# Patient Record
Sex: Female | Born: 1950 | Race: Black or African American | Hispanic: No
Health system: Southern US, Community
[De-identification: ages and names within clinical notes are randomized; demographics above are authoritative.]

---

## 2004-11-12 ENCOUNTER — Emergency Department: Payer: Self-pay | Admitting: Internal Medicine

## 2006-02-12 ENCOUNTER — Ambulatory Visit: Payer: Self-pay | Admitting: Internal Medicine

## 2007-03-31 ENCOUNTER — Ambulatory Visit: Payer: Self-pay | Admitting: Internal Medicine

## 2007-06-03 ENCOUNTER — Ambulatory Visit: Payer: Self-pay | Admitting: Gastroenterology

## 2008-04-27 ENCOUNTER — Ambulatory Visit: Payer: Self-pay | Admitting: Internal Medicine

## 2010-08-26 ENCOUNTER — Ambulatory Visit: Payer: Self-pay | Admitting: Internal Medicine

## 2010-09-05 ENCOUNTER — Ambulatory Visit: Payer: Self-pay | Admitting: Gastroenterology

## 2011-05-28 ENCOUNTER — Ambulatory Visit: Payer: Self-pay | Admitting: Internal Medicine

## 2011-06-11 ENCOUNTER — Ambulatory Visit: Payer: Self-pay | Admitting: Gastroenterology

## 2011-06-15 ENCOUNTER — Ambulatory Visit: Payer: Self-pay | Admitting: Gastroenterology

## 2011-06-17 LAB — PATHOLOGY REPORT

## 2011-06-18 ENCOUNTER — Ambulatory Visit: Payer: Self-pay | Admitting: Gastroenterology

## 2011-06-29 ENCOUNTER — Telehealth: Payer: Self-pay

## 2011-06-29 ENCOUNTER — Telehealth: Payer: Self-pay | Admitting: Gastroenterology

## 2011-06-29 DIAGNOSIS — K8689 Other specified diseases of pancreas: Secondary | ICD-10-CM

## 2011-06-29 NOTE — Telephone Encounter (Signed)
Pt aware of the change in location and time of the procedure pt has also been reinstructed

## 2011-06-29 NOTE — Telephone Encounter (Signed)
Need to call [place] and add to Thursday schedule for 3 pm pt already aware

## 2011-06-29 NOTE — Telephone Encounter (Signed)
Pt aware meds reviewed and instructions mailed

## 2011-06-30 NOTE — Telephone Encounter (Signed)
Pt added to the [facility] schedule spoke with [place]

## 2011-07-01 ENCOUNTER — Telehealth: Payer: Self-pay | Admitting: Gastroenterology

## 2011-07-01 NOTE — Telephone Encounter (Signed)
Pt had question about her EUS all questions were answered

## 2011-07-02 ENCOUNTER — Encounter: Payer: Self-pay | Admitting: Gastroenterology

## 2011-07-02 ENCOUNTER — Ambulatory Visit: Payer: Self-pay | Admitting: Oncology

## 2011-07-02 ENCOUNTER — Ambulatory Visit: Payer: Self-pay

## 2011-07-07 ENCOUNTER — Telehealth: Payer: Self-pay | Admitting: Gastroenterology

## 2011-07-07 NOTE — Telephone Encounter (Signed)
I spoke with her about FNA results (adenocarcinoma) I also discussed with Dr. Marva Panda, he will be arranging oncology referral.

## 2011-07-08 ENCOUNTER — Encounter: Payer: Self-pay | Admitting: Gastroenterology

## 2011-07-10 ENCOUNTER — Ambulatory Visit: Payer: Self-pay | Admitting: Oncology

## 2011-07-21 ENCOUNTER — Ambulatory Visit: Payer: Self-pay | Admitting: Surgery

## 2011-07-23 ENCOUNTER — Ambulatory Visit: Payer: Self-pay | Admitting: Oncology

## 2011-08-22 ENCOUNTER — Ambulatory Visit: Payer: Self-pay | Admitting: Oncology

## 2011-09-22 ENCOUNTER — Ambulatory Visit: Payer: Self-pay | Admitting: Oncology

## 2011-10-15 LAB — COMPREHENSIVE METABOLIC PANEL
Albumin: 3.5 g/dL (ref 3.4–5.0)
Alkaline Phosphatase: 189 U/L — ABNORMAL HIGH (ref 50–136)
Anion Gap: 12 (ref 7–16)
BUN: 12 mg/dL (ref 7–18)
Chloride: 101 mmol/L (ref 98–107)
Creatinine: 1.13 mg/dL (ref 0.60–1.30)
EGFR (Non-African Amer.): 52 — ABNORMAL LOW
Glucose: 85 mg/dL (ref 65–99)
Osmolality: 273 (ref 275–301)
SGOT(AST): 32 U/L (ref 15–37)
SGPT (ALT): 37 U/L
Sodium: 137 mmol/L (ref 136–145)
Total Protein: 8.4 g/dL — ABNORMAL HIGH (ref 6.4–8.2)

## 2011-10-15 LAB — CBC CANCER CENTER
Basophil #: 0.1 x10 3/mm (ref 0.0–0.1)
HCT: 32.6 % — ABNORMAL LOW (ref 35.0–47.0)
HGB: 11.1 g/dL — ABNORMAL LOW (ref 12.0–16.0)
Lymphocyte #: 1.5 x10 3/mm (ref 1.0–3.6)
Lymphocyte %: 17.1 %
MCH: 29.9 pg (ref 26.0–34.0)
Monocyte #: 0.6 x10 3/mm (ref 0.0–0.7)
Monocyte %: 6.4 %
Neutrophil #: 6.6 x10 3/mm — ABNORMAL HIGH (ref 1.4–6.5)
Neutrophil %: 74.6 %
Platelet: 213 x10 3/mm (ref 150–440)
RBC: 3.73 10*6/uL — ABNORMAL LOW (ref 3.80–5.20)
WBC: 8.8 x10 3/mm (ref 3.6–11.0)

## 2011-10-16 LAB — CANCER ANTIGEN 19-9: CA 19-9: 55 U/mL — ABNORMAL HIGH (ref 0–35)

## 2011-10-23 ENCOUNTER — Ambulatory Visit: Payer: Self-pay | Admitting: Oncology

## 2011-10-29 LAB — CBC CANCER CENTER
Basophil #: 0 x10 3/mm (ref 0.0–0.1)
Basophil %: 0 %
Eosinophil #: 0 x10 3/mm (ref 0.0–0.7)
Eosinophil %: 0.6 %
HCT: 30.9 % — ABNORMAL LOW (ref 35.0–47.0)
HGB: 10.5 g/dL — ABNORMAL LOW (ref 12.0–16.0)
Lymphocyte %: 16.1 %
MCHC: 34.1 g/dL (ref 32.0–36.0)
Monocyte #: 0.3 x10 3/mm (ref 0.0–0.7)
RDW: 14.1 % (ref 11.5–14.5)

## 2011-11-05 LAB — CBC CANCER CENTER
Basophil #: 0 x10 3/mm (ref 0.0–0.1)
Eosinophil %: 0.3 %
Lymphocyte %: 27.9 %
Lymphocytes: 34 %
MCHC: 34 g/dL (ref 32.0–36.0)
Monocyte #: 0.3 x10 3/mm (ref 0.0–0.7)
Monocyte %: 7.3 %
Monocytes: 7 %
NRBC/100 WBC: 1 /100
Platelet: 124 x10 3/mm — ABNORMAL LOW (ref 150–440)
RBC: 3.47 10*6/uL — ABNORMAL LOW (ref 3.80–5.20)
RDW: 14.4 % (ref 11.5–14.5)
Segmented Neutrophils: 59 %
WBC: 3.6 x10 3/mm (ref 3.6–11.0)

## 2011-11-12 LAB — CBC CANCER CENTER
Bands: 2 %
Basophil #: 0 x10 3/mm (ref 0.0–0.1)
Eosinophil #: 0 x10 3/mm (ref 0.0–0.7)
HCT: 28 % — ABNORMAL LOW (ref 35.0–47.0)
HGB: 9.8 g/dL — ABNORMAL LOW (ref 12.0–16.0)
Lymphocyte #: 0.9 x10 3/mm — ABNORMAL LOW (ref 1.0–3.6)
Lymphocyte %: 21.5 %
Lymphocytes: 22 %
MCH: 30.3 pg (ref 26.0–34.0)
Monocyte %: 5.2 %
Monocytes: 3 %
Neutrophil #: 3.1 x10 3/mm (ref 1.4–6.5)
Neutrophil %: 73 %
Platelet: 163 x10 3/mm (ref 150–440)
RDW: 14.6 % — ABNORMAL HIGH (ref 11.5–14.5)
Segmented Neutrophils: 73 %
WBC: 4.3 x10 3/mm (ref 3.6–11.0)

## 2011-11-18 LAB — CBC CANCER CENTER
Bands: 3 %
Basophil %: 0 %
Basophil: 1 %
HGB: 9.1 g/dL — ABNORMAL LOW (ref 12.0–16.0)
Lymphocyte #: 0.6 x10 3/mm — ABNORMAL LOW (ref 1.0–3.6)
Lymphocyte %: 34.3 %
Lymphocytes: 33 %
MCH: 30.2 pg (ref 26.0–34.0)
MCHC: 34.9 g/dL (ref 32.0–36.0)
Metamyelocyte: 3 %
Monocyte #: 0.1 x10 3/mm (ref 0.0–0.7)
Monocyte %: 6.6 %
Monocytes: 11 %
Myelocyte: 3 %
Neutrophil #: 1.1 x10 3/mm — ABNORMAL LOW (ref 1.4–6.5)
Platelet: 188 x10 3/mm (ref 150–440)
RDW: 15.4 % — ABNORMAL HIGH (ref 11.5–14.5)
Variant Lymphocyte: 2 %

## 2011-11-18 LAB — COMPREHENSIVE METABOLIC PANEL
BUN: 17 mg/dL (ref 7–18)
Bilirubin,Total: 0.4 mg/dL (ref 0.2–1.0)
Chloride: 102 mmol/L (ref 98–107)
Co2: 24 mmol/L (ref 21–32)
Creatinine: 1.25 mg/dL (ref 0.60–1.30)
EGFR (African American): 57 — ABNORMAL LOW
EGFR (Non-African Amer.): 47 — ABNORMAL LOW
Osmolality: 275 (ref 275–301)
SGOT(AST): 73 U/L — ABNORMAL HIGH (ref 15–37)
SGPT (ALT): 166 U/L — ABNORMAL HIGH
Sodium: 137 mmol/L (ref 136–145)
Total Protein: 8 g/dL (ref 6.4–8.2)

## 2011-11-18 LAB — PROTIME-INR
INR: 0.9
Prothrombin Time: 12.3 secs (ref 11.5–14.7)

## 2011-11-20 ENCOUNTER — Ambulatory Visit: Payer: Self-pay | Admitting: Oncology

## 2011-11-26 LAB — CBC CANCER CENTER
Bands: 2 %
Basophil %: 0.1 %
Eosinophil #: 0.1 x10 3/mm (ref 0.0–0.7)
Eosinophil %: 0.6 %
HCT: 28.5 % — ABNORMAL LOW (ref 35.0–47.0)
Lymphocyte %: 11.1 %
Lymphocytes: 18 %
MCH: 30.2 pg (ref 26.0–34.0)
MCHC: 34.3 g/dL (ref 32.0–36.0)
Monocyte #: 1.3 x10 3/mm — ABNORMAL HIGH (ref 0.0–0.7)
Monocyte %: 11.9 %
Neutrophil %: 76.3 %
Platelet: 372 x10 3/mm (ref 150–440)
RBC: 3.24 10*6/uL — ABNORMAL LOW (ref 3.80–5.20)
Segmented Neutrophils: 68 %

## 2011-12-03 LAB — BASIC METABOLIC PANEL
Anion Gap: 13 (ref 7–16)
Calcium, Total: 8.9 mg/dL (ref 8.5–10.1)
Chloride: 100 mmol/L (ref 98–107)
Co2: 22 mmol/L (ref 21–32)
EGFR (African American): 48 — ABNORMAL LOW
Osmolality: 272 (ref 275–301)
Potassium: 3.8 mmol/L (ref 3.5–5.1)

## 2011-12-03 LAB — CBC CANCER CENTER
Basophil %: 0.3 %
Eosinophil %: 0.5 %
HCT: 27.8 % — ABNORMAL LOW (ref 35.0–47.0)
HGB: 9.5 g/dL — ABNORMAL LOW (ref 12.0–16.0)
Lymphocyte #: 1 x10 3/mm (ref 1.0–3.6)
MCH: 30.2 pg (ref 26.0–34.0)
MCV: 89 fL (ref 80–100)
Monocyte #: 0.6 x10 3/mm (ref 0.0–0.7)
Monocyte %: 10.2 %
Neutrophil #: 4.1 x10 3/mm (ref 1.4–6.5)
RBC: 3.14 10*6/uL — ABNORMAL LOW (ref 3.80–5.20)

## 2011-12-03 LAB — MAGNESIUM: Magnesium: 2.1 mg/dL

## 2011-12-10 LAB — CBC CANCER CENTER
Basophil %: 0 %
Eosinophil #: 0 x10 3/mm (ref 0.0–0.7)
Eosinophil %: 0.2 %
HCT: 27.2 % — ABNORMAL LOW (ref 35.0–47.0)
Lymphocyte %: 21.4 %
MCH: 30.2 pg (ref 26.0–34.0)
MCHC: 33.3 g/dL (ref 32.0–36.0)
MCV: 91 fL (ref 80–100)
Monocyte #: 0.4 x10 3/mm (ref 0.0–0.7)
Monocyte %: 9.8 %
Neutrophil #: 2.8 x10 3/mm (ref 1.4–6.5)
Neutrophil %: 68.6 %
Platelet: 176 x10 3/mm (ref 150–440)
RDW: 17.4 % — ABNORMAL HIGH (ref 11.5–14.5)
WBC: 4 x10 3/mm (ref 3.6–11.0)

## 2011-12-10 LAB — BASIC METABOLIC PANEL
Anion Gap: 7 (ref 7–16)
BUN: 15 mg/dL (ref 7–18)
Creatinine: 1.19 mg/dL (ref 0.60–1.30)
EGFR (African American): 60 — ABNORMAL LOW
Osmolality: 271 (ref 275–301)

## 2011-12-21 ENCOUNTER — Ambulatory Visit: Payer: Self-pay | Admitting: Oncology

## 2011-12-24 LAB — COMPREHENSIVE METABOLIC PANEL
Alkaline Phosphatase: 230 U/L — ABNORMAL HIGH (ref 50–136)
BUN: 15 mg/dL (ref 7–18)
Bilirubin,Total: 0.4 mg/dL (ref 0.2–1.0)
Calcium, Total: 9.5 mg/dL (ref 8.5–10.1)
Chloride: 102 mmol/L (ref 98–107)
Co2: 23 mmol/L (ref 21–32)
EGFR (Non-African Amer.): 45 — ABNORMAL LOW
Osmolality: 273 (ref 275–301)
Potassium: 5.3 mmol/L — ABNORMAL HIGH (ref 3.5–5.1)
SGOT(AST): 70 U/L — ABNORMAL HIGH (ref 15–37)
SGPT (ALT): 71 U/L
Sodium: 136 mmol/L (ref 136–145)
Total Protein: 8.2 g/dL (ref 6.4–8.2)

## 2011-12-24 LAB — CBC CANCER CENTER
Eosinophil: 1 %
HCT: 29.7 % — ABNORMAL LOW (ref 35.0–47.0)
MCHC: 34.1 g/dL (ref 32.0–36.0)
MCV: 90 fL (ref 80–100)
Platelet: 477 x10 3/mm — ABNORMAL HIGH (ref 150–440)
RBC: 3.29 10*6/uL — ABNORMAL LOW (ref 3.80–5.20)
RDW: 19.8 % — ABNORMAL HIGH (ref 11.5–14.5)

## 2012-01-20 ENCOUNTER — Ambulatory Visit: Payer: Self-pay | Admitting: Oncology

## 2012-01-21 LAB — COMPREHENSIVE METABOLIC PANEL
Albumin: 3.4 g/dL (ref 3.4–5.0)
Alkaline Phosphatase: 376 U/L — ABNORMAL HIGH (ref 50–136)
Anion Gap: 10 (ref 7–16)
BUN: 20 mg/dL — ABNORMAL HIGH (ref 7–18)
Bilirubin,Total: 0.5 mg/dL (ref 0.2–1.0)
Chloride: 101 mmol/L (ref 98–107)
Creatinine: 1.34 mg/dL — ABNORMAL HIGH (ref 0.60–1.30)
EGFR (African American): 50 — ABNORMAL LOW
EGFR (Non-African Amer.): 43 — ABNORMAL LOW
Osmolality: 277 (ref 275–301)
Potassium: 4 mmol/L (ref 3.5–5.1)
SGPT (ALT): 192 U/L — ABNORMAL HIGH
Sodium: 138 mmol/L (ref 136–145)
Total Protein: 8.1 g/dL (ref 6.4–8.2)

## 2012-01-21 LAB — CBC CANCER CENTER
Basophil #: 0.1 x10 3/mm (ref 0.0–0.1)
Basophil %: 0.8 %
Eosinophil #: 0.2 x10 3/mm (ref 0.0–0.7)
Eosinophil %: 2.2 %
HCT: 30.4 % — ABNORMAL LOW (ref 35.0–47.0)
MCH: 28.8 pg (ref 26.0–34.0)
MCHC: 33.5 g/dL (ref 32.0–36.0)
MCV: 86 fL (ref 80–100)
Monocyte #: 0.8 x10 3/mm (ref 0.2–0.9)
Monocyte %: 9.4 %
Neutrophil %: 70.5 %
RBC: 3.53 10*6/uL — ABNORMAL LOW (ref 3.80–5.20)
RDW: 17 % — ABNORMAL HIGH (ref 11.5–14.5)
WBC: 8.7 x10 3/mm (ref 3.6–11.0)

## 2012-02-20 ENCOUNTER — Ambulatory Visit: Payer: Self-pay | Admitting: Oncology

## 2012-02-23 LAB — COMPREHENSIVE METABOLIC PANEL
Albumin: 3.6 g/dL (ref 3.4–5.0)
Alkaline Phosphatase: 621 U/L — ABNORMAL HIGH (ref 50–136)
Anion Gap: 9 (ref 7–16)
BUN: 28 mg/dL — ABNORMAL HIGH (ref 7–18)
Bilirubin,Total: 0.5 mg/dL (ref 0.2–1.0)
Calcium, Total: 9.8 mg/dL (ref 8.5–10.1)
Chloride: 100 mmol/L (ref 98–107)
Co2: 26 mmol/L (ref 21–32)
Creatinine: 1.37 mg/dL — ABNORMAL HIGH (ref 0.60–1.30)
Glucose: 101 mg/dL — ABNORMAL HIGH (ref 65–99)
Osmolality: 276 (ref 275–301)
SGOT(AST): 71 U/L — ABNORMAL HIGH (ref 15–37)
SGPT (ALT): 70 U/L
Total Protein: 9 g/dL — ABNORMAL HIGH (ref 6.4–8.2)

## 2012-02-23 LAB — CBC CANCER CENTER
Basophil #: 0 x10 3/mm (ref 0.0–0.1)
Basophil %: 0.4 %
Eosinophil #: 0.1 x10 3/mm (ref 0.0–0.7)
Eosinophil %: 1 %
HCT: 34.3 % — ABNORMAL LOW (ref 35.0–47.0)
HGB: 11.5 g/dL — ABNORMAL LOW (ref 12.0–16.0)
Lymphocyte #: 1.4 x10 3/mm (ref 1.0–3.6)
Lymphocyte %: 14.3 %
MCH: 27.5 pg (ref 26.0–34.0)
Monocyte #: 0.6 x10 3/mm (ref 0.2–0.9)
Neutrophil #: 7.7 x10 3/mm — ABNORMAL HIGH (ref 1.4–6.5)
Neutrophil %: 77.8 %
Platelet: 251 x10 3/mm (ref 150–440)
RDW: 16.2 % — ABNORMAL HIGH (ref 11.5–14.5)
WBC: 10 x10 3/mm (ref 3.6–11.0)

## 2012-02-24 LAB — CANCER ANTIGEN 19-9: CA 19-9: 125 U/mL — ABNORMAL HIGH (ref 0–35)

## 2012-03-02 LAB — CBC CANCER CENTER
Basophil #: 0.1 x10 3/mm (ref 0.0–0.1)
Basophil %: 0.6 %
HCT: 32.8 % — ABNORMAL LOW (ref 35.0–47.0)
Lymphocyte #: 0.7 x10 3/mm — ABNORMAL LOW (ref 1.0–3.6)
MCH: 27.2 pg (ref 26.0–34.0)
MCHC: 33.9 g/dL (ref 32.0–36.0)
MCV: 80 fL (ref 80–100)
Neutrophil #: 7.9 x10 3/mm — ABNORMAL HIGH (ref 1.4–6.5)
Neutrophil %: 82.1 %
RBC: 4.09 10*6/uL (ref 3.80–5.20)
RDW: 15.9 % — ABNORMAL HIGH (ref 11.5–14.5)
WBC: 9.7 x10 3/mm (ref 3.6–11.0)

## 2012-03-09 LAB — CBC CANCER CENTER
Basophil #: 0 x10 3/mm (ref 0.0–0.1)
Eosinophil #: 0 x10 3/mm (ref 0.0–0.7)
Eosinophil %: 0.4 %
Lymphocyte #: 1.1 x10 3/mm (ref 1.0–3.6)
MCH: 27.1 pg (ref 26.0–34.0)
Neutrophil #: 5.4 x10 3/mm (ref 1.4–6.5)
Neutrophil %: 74.4 %
Platelet: 199 x10 3/mm (ref 150–440)
RBC: 3.75 10*6/uL — ABNORMAL LOW (ref 3.80–5.20)
WBC: 7.2 x10 3/mm (ref 3.6–11.0)

## 2012-03-16 LAB — CBC CANCER CENTER
Basophil #: 0 x10 3/mm (ref 0.0–0.1)
Basophil %: 0.2 %
Eosinophil #: 0 x10 3/mm (ref 0.0–0.7)
Eosinophil %: 0.9 %
HGB: 9.7 g/dL — ABNORMAL LOW (ref 12.0–16.0)
Lymphocyte #: 1 x10 3/mm (ref 1.0–3.6)
Lymphocyte %: 19.2 %
MCH: 26.9 pg (ref 26.0–34.0)
MCHC: 33.8 g/dL (ref 32.0–36.0)
MCV: 80 fL (ref 80–100)
Monocyte #: 0.5 x10 3/mm (ref 0.2–0.9)
Monocyte %: 9.1 %
Neutrophil %: 70.6 %
RBC: 3.6 10*6/uL — ABNORMAL LOW (ref 3.80–5.20)
RDW: 15.7 % — ABNORMAL HIGH (ref 11.5–14.5)

## 2012-03-16 LAB — COMPREHENSIVE METABOLIC PANEL
Albumin: 3 g/dL — ABNORMAL LOW (ref 3.4–5.0)
Bilirubin,Total: 0.6 mg/dL (ref 0.2–1.0)
Calcium, Total: 9.2 mg/dL (ref 8.5–10.1)
Chloride: 96 mmol/L — ABNORMAL LOW (ref 98–107)
Creatinine: 1.23 mg/dL (ref 0.60–1.30)
EGFR (African American): 55 — ABNORMAL LOW
EGFR (Non-African Amer.): 48 — ABNORMAL LOW
Glucose: 111 mg/dL — ABNORMAL HIGH (ref 65–99)
Osmolality: 269 (ref 275–301)
SGPT (ALT): 108 U/L — ABNORMAL HIGH
Sodium: 132 mmol/L — ABNORMAL LOW (ref 136–145)
Total Protein: 8 g/dL (ref 6.4–8.2)

## 2012-03-21 ENCOUNTER — Ambulatory Visit: Payer: Self-pay | Admitting: Oncology

## 2012-03-30 LAB — COMPREHENSIVE METABOLIC PANEL
Alkaline Phosphatase: 496 U/L — ABNORMAL HIGH (ref 50–136)
Anion Gap: 10 (ref 7–16)
BUN: 17 mg/dL (ref 7–18)
Bilirubin,Total: 0.6 mg/dL (ref 0.2–1.0)
Calcium, Total: 9.2 mg/dL (ref 8.5–10.1)
Chloride: 101 mmol/L (ref 98–107)
Creatinine: 1.22 mg/dL (ref 0.60–1.30)
EGFR (African American): 56 — ABNORMAL LOW
EGFR (Non-African Amer.): 48 — ABNORMAL LOW
Glucose: 95 mg/dL (ref 65–99)
Osmolality: 271 (ref 275–301)
Potassium: 4.2 mmol/L (ref 3.5–5.1)
Sodium: 135 mmol/L — ABNORMAL LOW (ref 136–145)
Total Protein: 7.9 g/dL (ref 6.4–8.2)

## 2012-03-30 LAB — CBC CANCER CENTER
Basophil #: 0.1 x10 3/mm (ref 0.0–0.1)
HCT: 27.2 % — ABNORMAL LOW (ref 35.0–47.0)
HGB: 9.2 g/dL — ABNORMAL LOW (ref 12.0–16.0)
Lymphocyte #: 1.1 x10 3/mm (ref 1.0–3.6)
MCH: 27.7 pg (ref 26.0–34.0)
MCHC: 33.9 g/dL (ref 32.0–36.0)
MCV: 82 fL (ref 80–100)
Monocyte #: 1.4 x10 3/mm — ABNORMAL HIGH (ref 0.2–0.9)
Monocyte %: 18.5 %
Neutrophil #: 5 x10 3/mm (ref 1.4–6.5)
Platelet: 490 x10 3/mm — ABNORMAL HIGH (ref 150–440)
RDW: 16.9 % — ABNORMAL HIGH (ref 11.5–14.5)
WBC: 7.7 x10 3/mm (ref 3.6–11.0)

## 2012-04-06 LAB — BASIC METABOLIC PANEL
Anion Gap: 10 (ref 7–16)
BUN: 19 mg/dL — ABNORMAL HIGH (ref 7–18)
Co2: 24 mmol/L (ref 21–32)
Creatinine: 1.22 mg/dL (ref 0.60–1.30)
EGFR (African American): 56 — ABNORMAL LOW
Glucose: 120 mg/dL — ABNORMAL HIGH (ref 65–99)
Potassium: 4.3 mmol/L (ref 3.5–5.1)
Sodium: 135 mmol/L — ABNORMAL LOW (ref 136–145)

## 2012-04-06 LAB — CBC CANCER CENTER
Basophil #: 0.1 x10 3/mm (ref 0.0–0.1)
Eosinophil #: 0.1 x10 3/mm (ref 0.0–0.7)
HCT: 26.5 % — ABNORMAL LOW (ref 35.0–47.0)
Lymphocyte #: 0.9 x10 3/mm — ABNORMAL LOW (ref 1.0–3.6)
MCHC: 34 g/dL (ref 32.0–36.0)
MCV: 81 fL (ref 80–100)
Neutrophil #: 3.7 x10 3/mm (ref 1.4–6.5)
Neutrophil %: 67 %
RBC: 3.25 10*6/uL — ABNORMAL LOW (ref 3.80–5.20)
RDW: 17.2 % — ABNORMAL HIGH (ref 11.5–14.5)

## 2012-04-13 LAB — BASIC METABOLIC PANEL
Anion Gap: 9 (ref 7–16)
BUN: 16 mg/dL (ref 7–18)
Calcium, Total: 9.3 mg/dL (ref 8.5–10.1)
Creatinine: 1.18 mg/dL (ref 0.60–1.30)
EGFR (African American): 58 — ABNORMAL LOW
EGFR (Non-African Amer.): 50 — ABNORMAL LOW
Glucose: 69 mg/dL (ref 65–99)
Potassium: 4 mmol/L (ref 3.5–5.1)
Sodium: 134 mmol/L — ABNORMAL LOW (ref 136–145)

## 2012-04-13 LAB — CBC CANCER CENTER
Basophil #: 0 x10 3/mm (ref 0.0–0.1)
Basophil %: 0.6 %
Eosinophil #: 0 x10 3/mm (ref 0.0–0.7)
Eosinophil %: 0.4 %
HCT: 25.3 % — ABNORMAL LOW (ref 35.0–47.0)
Lymphocyte #: 0.9 x10 3/mm — ABNORMAL LOW (ref 1.0–3.6)
MCHC: 33.4 g/dL (ref 32.0–36.0)
MCV: 82 fL (ref 80–100)
Monocyte #: 0.5 x10 3/mm (ref 0.2–0.9)
Monocyte %: 14.7 %
Neutrophil %: 58 %
Platelet: 156 x10 3/mm (ref 150–440)
RBC: 3.08 10*6/uL — ABNORMAL LOW (ref 3.80–5.20)
RDW: 17.4 % — ABNORMAL HIGH (ref 11.5–14.5)
WBC: 3.4 x10 3/mm — ABNORMAL LOW (ref 3.6–11.0)

## 2012-04-14 LAB — CANCER ANTIGEN 19-9: CA 19-9: 77 U/mL — ABNORMAL HIGH (ref 0–35)

## 2012-04-21 ENCOUNTER — Ambulatory Visit: Payer: Self-pay | Admitting: Oncology

## 2012-04-27 LAB — COMPREHENSIVE METABOLIC PANEL
Albumin: 2.9 g/dL — ABNORMAL LOW (ref 3.4–5.0)
Alkaline Phosphatase: 374 U/L — ABNORMAL HIGH (ref 50–136)
Anion Gap: 11 (ref 7–16)
BUN: 19 mg/dL — ABNORMAL HIGH (ref 7–18)
Calcium, Total: 9.1 mg/dL (ref 8.5–10.1)
Co2: 25 mmol/L (ref 21–32)
EGFR (Non-African Amer.): 41 — ABNORMAL LOW
Osmolality: 276 (ref 275–301)
Potassium: 3.6 mmol/L (ref 3.5–5.1)
Sodium: 135 mmol/L — ABNORMAL LOW (ref 136–145)

## 2012-04-27 LAB — CBC CANCER CENTER
Basophil: 1 %
Eosinophil: 3 %
HCT: 26.2 % — ABNORMAL LOW (ref 35.0–47.0)
Lymphocytes: 12 %
MCHC: 34.2 g/dL (ref 32.0–36.0)
Segmented Neutrophils: 68 %

## 2012-05-04 LAB — CBC CANCER CENTER
Basophil #: 0.1 x10 3/mm (ref 0.0–0.1)
Eosinophil #: 0.1 x10 3/mm (ref 0.0–0.7)
Eosinophil %: 1.4 %
HCT: 26.1 % — ABNORMAL LOW (ref 35.0–47.0)
HGB: 8.7 g/dL — ABNORMAL LOW (ref 12.0–16.0)
Lymphocyte #: 1.1 x10 3/mm (ref 1.0–3.6)
Lymphocyte %: 23.8 %
MCH: 28.2 pg (ref 26.0–34.0)
Monocyte #: 0.8 x10 3/mm (ref 0.2–0.9)
Neutrophil %: 56.9 %
RBC: 3.09 10*6/uL — ABNORMAL LOW (ref 3.80–5.20)
WBC: 4.7 x10 3/mm (ref 3.6–11.0)

## 2012-05-04 LAB — IRON AND TIBC
Iron Bind.Cap.(Total): 268 ug/dL (ref 250–450)
Iron Saturation: 19 %
Iron: 52 ug/dL (ref 50–170)
Unbound Iron-Bind.Cap.: 216 ug/dL

## 2012-05-04 LAB — COMPREHENSIVE METABOLIC PANEL
Alkaline Phosphatase: 340 U/L — ABNORMAL HIGH (ref 50–136)
BUN: 17 mg/dL (ref 7–18)
Chloride: 101 mmol/L (ref 98–107)
Creatinine: 1.43 mg/dL — ABNORMAL HIGH (ref 0.60–1.30)
EGFR (Non-African Amer.): 40 — ABNORMAL LOW
Glucose: 112 mg/dL — ABNORMAL HIGH (ref 65–99)
Osmolality: 272 (ref 275–301)
Potassium: 3.8 mmol/L (ref 3.5–5.1)
Sodium: 135 mmol/L — ABNORMAL LOW (ref 136–145)

## 2012-05-04 LAB — FERRITIN: Ferritin (ARMC): 851 ng/mL — ABNORMAL HIGH (ref 8–388)

## 2012-05-11 LAB — CBC CANCER CENTER
Basophil #: 0 x10 3/mm (ref 0.0–0.1)
Basophil %: 0.8 %
HCT: 25.3 % — ABNORMAL LOW (ref 35.0–47.0)
HGB: 8.5 g/dL — ABNORMAL LOW (ref 12.0–16.0)
Lymphocyte %: 20.1 %
Monocyte %: 9.3 %
Neutrophil #: 1.5 x10 3/mm (ref 1.4–6.5)
RBC: 2.97 10*6/uL — ABNORMAL LOW (ref 3.80–5.20)
RDW: 21.4 % — ABNORMAL HIGH (ref 11.5–14.5)
WBC: 2.3 x10 3/mm — ABNORMAL LOW (ref 3.6–11.0)

## 2012-05-11 LAB — COMPREHENSIVE METABOLIC PANEL
Albumin: 3.1 g/dL — ABNORMAL LOW (ref 3.4–5.0)
Anion Gap: 11 (ref 7–16)
BUN: 18 mg/dL (ref 7–18)
Bilirubin,Total: 0.5 mg/dL (ref 0.2–1.0)
Chloride: 103 mmol/L (ref 98–107)
Creatinine: 1.25 mg/dL (ref 0.60–1.30)
Glucose: 159 mg/dL — ABNORMAL HIGH (ref 65–99)
Potassium: 3.7 mmol/L (ref 3.5–5.1)
SGOT(AST): 48 U/L — ABNORMAL HIGH (ref 15–37)
Total Protein: 7.4 g/dL (ref 6.4–8.2)

## 2012-05-22 ENCOUNTER — Ambulatory Visit: Payer: Self-pay | Admitting: Oncology

## 2012-05-25 LAB — CBC CANCER CENTER
Bands: 2 %
Eosinophil: 4 %
HCT: 25.2 % — ABNORMAL LOW (ref 35.0–47.0)
HGB: 8.3 g/dL — ABNORMAL LOW (ref 12.0–16.0)
Lymphocytes: 4 %
MCH: 28 pg (ref 26.0–34.0)
MCHC: 32.8 g/dL (ref 32.0–36.0)
MCV: 85 fL (ref 80–100)
Monocytes: 13 %
RBC: 2.95 10*6/uL — ABNORMAL LOW (ref 3.80–5.20)
RDW: 22.2 % — ABNORMAL HIGH (ref 11.5–14.5)
Segmented Neutrophils: 77 %

## 2012-05-25 LAB — COMPREHENSIVE METABOLIC PANEL
Alkaline Phosphatase: 312 U/L — ABNORMAL HIGH (ref 50–136)
Bilirubin,Total: 0.5 mg/dL (ref 0.2–1.0)
Chloride: 98 mmol/L (ref 98–107)
Co2: 27 mmol/L (ref 21–32)
Creatinine: 1.47 mg/dL — ABNORMAL HIGH (ref 0.60–1.30)
EGFR (Non-African Amer.): 38 — ABNORMAL LOW
Osmolality: 267 (ref 275–301)
SGOT(AST): 53 U/L — ABNORMAL HIGH (ref 15–37)
Sodium: 133 mmol/L — ABNORMAL LOW (ref 136–145)

## 2012-06-01 LAB — CBC CANCER CENTER
Basophil #: 0.1 x10 3/mm (ref 0.0–0.1)
Eosinophil #: 0 x10 3/mm (ref 0.0–0.7)
HCT: 24.3 % — ABNORMAL LOW (ref 35.0–47.0)
HGB: 8.2 g/dL — ABNORMAL LOW (ref 12.0–16.0)
MCH: 28.5 pg (ref 26.0–34.0)
MCV: 85 fL (ref 80–100)
Monocyte %: 19.4 %
Platelet: 445 x10 3/mm — ABNORMAL HIGH (ref 150–440)
RBC: 2.87 10*6/uL — ABNORMAL LOW (ref 3.80–5.20)

## 2012-06-01 LAB — COMPREHENSIVE METABOLIC PANEL
Albumin: 2.7 g/dL — ABNORMAL LOW (ref 3.4–5.0)
Alkaline Phosphatase: 288 U/L — ABNORMAL HIGH (ref 50–136)
Anion Gap: 6 — ABNORMAL LOW (ref 7–16)
Calcium, Total: 8.5 mg/dL (ref 8.5–10.1)
Co2: 28 mmol/L (ref 21–32)
Creatinine: 1.46 mg/dL — ABNORMAL HIGH (ref 0.60–1.30)
Glucose: 122 mg/dL — ABNORMAL HIGH (ref 65–99)
SGPT (ALT): 46 U/L (ref 12–78)
Total Protein: 7.2 g/dL (ref 6.4–8.2)

## 2012-06-01 LAB — IRON AND TIBC
Iron Bind.Cap.(Total): 245 ug/dL — ABNORMAL LOW (ref 250–450)
Unbound Iron-Bind.Cap.: 207 ug/dL

## 2012-06-01 LAB — FERRITIN: Ferritin (ARMC): 1021 ng/mL — ABNORMAL HIGH (ref 8–388)

## 2012-06-01 LAB — FOLATE: Folic Acid: 19.6 ng/mL (ref 3.1–100.0)

## 2012-06-21 ENCOUNTER — Ambulatory Visit: Payer: Self-pay | Admitting: Oncology

## 2012-06-29 ENCOUNTER — Ambulatory Visit: Payer: Self-pay | Admitting: Oncology

## 2012-06-29 LAB — COMPREHENSIVE METABOLIC PANEL
Albumin: 3 g/dL — ABNORMAL LOW (ref 3.4–5.0)
Alkaline Phosphatase: 403 U/L — ABNORMAL HIGH (ref 50–136)
Anion Gap: 9 (ref 7–16)
BUN: 42 mg/dL — ABNORMAL HIGH (ref 7–18)
Bilirubin,Total: 0.9 mg/dL (ref 0.2–1.0)
Calcium, Total: 9.1 mg/dL (ref 8.5–10.1)
Creatinine: 1.84 mg/dL — ABNORMAL HIGH (ref 0.60–1.30)
EGFR (African American): 34 — ABNORMAL LOW
EGFR (Non-African Amer.): 29 — ABNORMAL LOW
Glucose: 101 mg/dL — ABNORMAL HIGH (ref 65–99)
Osmolality: 286 (ref 275–301)
Potassium: 5 mmol/L (ref 3.5–5.1)
SGOT(AST): 72 U/L — ABNORMAL HIGH (ref 15–37)
SGPT (ALT): 39 U/L (ref 12–78)
Sodium: 138 mmol/L (ref 136–145)
Total Protein: 7.7 g/dL (ref 6.4–8.2)

## 2012-06-29 LAB — CBC CANCER CENTER
Basophil #: 0.1 x10 3/mm (ref 0.0–0.1)
Basophil %: 1 %
Eosinophil %: 1.6 %
Lymphocyte %: 14.6 %
MCH: 26.1 pg (ref 26.0–34.0)
Monocyte #: 1.5 x10 3/mm — ABNORMAL HIGH (ref 0.2–0.9)
Monocyte %: 12.3 %
Neutrophil %: 70.5 %
Platelet: 164 x10 3/mm (ref 150–440)
RBC: 3.61 10*6/uL — ABNORMAL LOW (ref 3.80–5.20)

## 2012-06-30 LAB — CANCER ANTIGEN 19-9: CA 19-9: 155 U/mL — ABNORMAL HIGH (ref 0–35)

## 2012-07-05 LAB — CREATININE, SERUM
Creatinine: 2.87 mg/dL — ABNORMAL HIGH (ref 0.60–1.30)
EGFR (African American): 20 — ABNORMAL LOW
EGFR (Non-African Amer.): 17 — ABNORMAL LOW

## 2012-07-06 ENCOUNTER — Inpatient Hospital Stay: Payer: Self-pay | Admitting: Oncology

## 2012-07-06 LAB — BASIC METABOLIC PANEL
Chloride: 105 mmol/L (ref 98–107)
Co2: 22 mmol/L (ref 21–32)
Creatinine: 2.78 mg/dL — ABNORMAL HIGH (ref 0.60–1.30)
EGFR (African American): 21 — ABNORMAL LOW
EGFR (Non-African Amer.): 18 — ABNORMAL LOW
Glucose: 83 mg/dL (ref 65–99)
Osmolality: 284 (ref 275–301)
Potassium: 4.1 mmol/L (ref 3.5–5.1)
Sodium: 136 mmol/L (ref 136–145)

## 2012-07-06 LAB — URINALYSIS, COMPLETE
Glucose,UR: NEGATIVE mg/dL (ref 0–75)
Ketone: NEGATIVE
Nitrite: NEGATIVE
Ph: 6 (ref 4.5–8.0)
Protein: >=500
Specific Gravity: 1.011 (ref 1.003–1.030)
Squamous Epithelial: 2

## 2012-07-06 LAB — CREATININE, SERUM
Creatinine: 2.94 mg/dL — ABNORMAL HIGH (ref 0.60–1.30)
EGFR (African American): 19 — ABNORMAL LOW
EGFR (Non-African Amer.): 17 — ABNORMAL LOW

## 2012-07-07 LAB — BASIC METABOLIC PANEL
Anion Gap: 9 (ref 7–16)
BUN: 46 mg/dL — ABNORMAL HIGH (ref 7–18)
Calcium, Total: 8.5 mg/dL (ref 8.5–10.1)
Chloride: 109 mmol/L — ABNORMAL HIGH (ref 98–107)
Co2: 20 mmol/L — ABNORMAL LOW (ref 21–32)
Osmolality: 287 (ref 275–301)
Potassium: 3.9 mmol/L (ref 3.5–5.1)

## 2012-07-07 LAB — CBC WITH DIFFERENTIAL/PLATELET
Basophil #: 0.1 10*3/uL (ref 0.0–0.1)
Basophil #: 0.1 10*3/uL (ref 0.0–0.1)
Basophil %: 0.9 %
Eosinophil #: 0.3 10*3/uL (ref 0.0–0.7)
Eosinophil %: 1.9 %
HCT: 18.2 % — ABNORMAL LOW (ref 35.0–47.0)
HCT: 18 % — ABNORMAL LOW (ref 35.0–47.0)
HGB: 6.4 g/dL — ABNORMAL LOW (ref 12.0–16.0)
Lymphocyte #: 1.2 10*3/uL (ref 1.0–3.6)
Lymphocyte %: 11.7 %
MCH: 27.2 pg (ref 26.0–34.0)
MCH: 28 pg (ref 26.0–34.0)
MCHC: 34.9 g/dL (ref 32.0–36.0)
MCHC: 35.8 g/dL (ref 32.0–36.0)
MCV: 78 fL — ABNORMAL LOW (ref 80–100)
Monocyte #: 0.9 x10 3/mm (ref 0.2–0.9)
Monocyte #: 0.9 x10 3/mm (ref 0.2–0.9)
Neutrophil #: 8.6 10*3/uL — ABNORMAL HIGH (ref 1.4–6.5)
Neutrophil #: 8.2 10*3/uL — ABNORMAL HIGH (ref 1.4–6.5)
Platelet: 51 10*3/uL — ABNORMAL LOW (ref 150–440)
RBC: 2.3 10*6/uL — ABNORMAL LOW (ref 3.80–5.20)
RDW: 24.4 % — ABNORMAL HIGH (ref 11.5–14.5)
RDW: 24.1 % — ABNORMAL HIGH (ref 11.5–14.5)
WBC: 11 10*3/uL (ref 3.6–11.0)

## 2012-07-07 LAB — PROT IMMUNOELECTROPHORES(ARMC)

## 2012-07-07 LAB — URINE IEP, RANDOM

## 2012-07-08 LAB — CBC WITH DIFFERENTIAL/PLATELET
Basophil #: 0.1 10*3/uL (ref 0.0–0.1)
Basophil %: 0.8 %
Basophil %: 0.8 %
Eosinophil #: 0.3 10*3/uL (ref 0.0–0.7)
Eosinophil %: 1.9 %
HCT: 22.2 % — ABNORMAL LOW (ref 35.0–47.0)
HCT: 19.8 % — ABNORMAL LOW (ref 35.0–47.0)
HGB: 7.6 g/dL — ABNORMAL LOW (ref 12.0–16.0)
HGB: 7.2 g/dL — ABNORMAL LOW (ref 12.0–16.0)
Lymphocyte #: 1.5 10*3/uL (ref 1.0–3.6)
Lymphocyte #: 1.1 10*3/uL (ref 1.0–3.6)
Lymphocyte %: 12.8 %
MCHC: 34.2 g/dL (ref 32.0–36.0)
Monocyte #: 1.1 x10 3/mm — ABNORMAL HIGH (ref 0.2–0.9)
Monocyte #: 1.1 x10 3/mm — ABNORMAL HIGH (ref 0.2–0.9)
Monocyte %: 9.1 %
Neutrophil #: 8 10*3/uL — ABNORMAL HIGH (ref 1.4–6.5)
Neutrophil %: 75.1 %
Platelet: 46 10*3/uL — ABNORMAL LOW (ref 150–440)
RBC: 2.55 10*6/uL — ABNORMAL LOW (ref 3.80–5.20)
RDW: 22.4 % — ABNORMAL HIGH (ref 11.5–14.5)
RDW: 21.9 % — ABNORMAL HIGH (ref 11.5–14.5)
Segmented Neutrophils: 82 %
WBC: 11.7 10*3/uL — ABNORMAL HIGH (ref 3.6–11.0)
WBC: 10.5 10*3/uL (ref 3.6–11.0)

## 2012-07-08 LAB — CREATININE, SERUM
Creatinine: 3.27 mg/dL — ABNORMAL HIGH (ref 0.60–1.30)
EGFR (African American): 19 — ABNORMAL LOW
EGFR (African American): 17 — ABNORMAL LOW
EGFR (Non-African Amer.): 16 — ABNORMAL LOW
EGFR (Non-African Amer.): 15 — ABNORMAL LOW

## 2012-07-08 LAB — LACTATE DEHYDROGENASE: LDH: 714 U/L — ABNORMAL HIGH (ref 81–246)

## 2012-07-08 LAB — URINE CULTURE

## 2012-07-08 LAB — HEMOGLOBIN
HGB: 8.2 g/dL — ABNORMAL LOW (ref 12.0–16.0)
HGB: 7.7 g/dL — ABNORMAL LOW (ref 12.0–16.0)

## 2012-07-08 LAB — POTASSIUM: Potassium: 4.2 mmol/L (ref 3.5–5.1)

## 2012-07-09 LAB — COMPREHENSIVE METABOLIC PANEL
Albumin: 2.5 g/dL — ABNORMAL LOW (ref 3.4–5.0)
Alkaline Phosphatase: 338 U/L — ABNORMAL HIGH (ref 50–136)
Anion Gap: 11 (ref 7–16)
BUN: 41 mg/dL — ABNORMAL HIGH (ref 7–18)
Co2: 18 mmol/L — ABNORMAL LOW (ref 21–32)
Creatinine: 3.49 mg/dL — ABNORMAL HIGH (ref 0.60–1.30)
EGFR (African American): 16 — ABNORMAL LOW
Glucose: 76 mg/dL (ref 65–99)
SGOT(AST): 68 U/L — ABNORMAL HIGH (ref 15–37)
SGPT (ALT): 32 U/L (ref 12–78)
Sodium: 136 mmol/L (ref 136–145)
Total Protein: 6.4 g/dL (ref 6.4–8.2)

## 2012-07-09 LAB — OCCULT BLOOD X 1 CARD TO LAB, STOOL: Occult Blood, Feces: NEGATIVE

## 2012-07-09 LAB — CBC WITH DIFFERENTIAL/PLATELET
Basophil #: 0.1 10*3/uL (ref 0.0–0.1)
Basophil %: 1 %
Basophil %: 0.9 %
Eosinophil #: 0.2 10*3/uL (ref 0.0–0.7)
Eosinophil %: 2.6 %
Eosinophil %: 1.9 %
HCT: 19.2 % — ABNORMAL LOW (ref 35.0–47.0)
HCT: 19.3 % — ABNORMAL LOW (ref 35.0–47.0)
HGB: 7 g/dL — ABNORMAL LOW (ref 12.0–16.0)
HGB: 7.1 g/dL — ABNORMAL LOW (ref 12.0–16.0)
Lymphocyte %: 12.3 %
Lymphocyte %: 13.8 %
MCH: 28.4 pg (ref 26.0–34.0)
MCH: 28.7 pg (ref 26.0–34.0)
MCHC: 36.7 g/dL — ABNORMAL HIGH (ref 32.0–36.0)
MCV: 78 fL — ABNORMAL LOW (ref 80–100)
Monocyte #: 1.1 x10 3/mm — ABNORMAL HIGH (ref 0.2–0.9)
Monocyte %: 11.1 %
Monocyte %: 10.6 %
Neutrophil #: 7.5 10*3/uL — ABNORMAL HIGH (ref 1.4–6.5)
Neutrophil #: 7.3 10*3/uL — ABNORMAL HIGH (ref 1.4–6.5)
Neutrophil %: 73 %
Neutrophil %: 72.8 %
Platelet: 50 10*3/uL — ABNORMAL LOW (ref 150–440)
RBC: 2.48 10*6/uL — ABNORMAL LOW (ref 3.80–5.20)
RDW: 22.9 % — ABNORMAL HIGH (ref 11.5–14.5)
WBC: 10.2 10*3/uL (ref 3.6–11.0)
WBC: 10.1 10*3/uL (ref 3.6–11.0)

## 2012-07-09 LAB — CREATININE, SERUM
Creatinine: 3.52 mg/dL — ABNORMAL HIGH (ref 0.60–1.30)
EGFR (Non-African Amer.): 13 — ABNORMAL LOW

## 2012-07-09 LAB — LACTATE DEHYDROGENASE: LDH: 764 U/L — ABNORMAL HIGH (ref 81–246)

## 2012-07-10 LAB — BASIC METABOLIC PANEL
Anion Gap: 10 (ref 7–16)
BUN: 47 mg/dL — ABNORMAL HIGH (ref 7–18)
Chloride: 107 mmol/L (ref 98–107)
Co2: 19 mmol/L — ABNORMAL LOW (ref 21–32)
Creatinine: 3.66 mg/dL — ABNORMAL HIGH (ref 0.60–1.30)
EGFR (Non-African Amer.): 13 — ABNORMAL LOW
Potassium: 4.1 mmol/L (ref 3.5–5.1)
Sodium: 136 mmol/L (ref 136–145)

## 2012-07-10 LAB — CBC WITH DIFFERENTIAL/PLATELET
Basophil %: 0.9 %
Eosinophil #: 0.3 10*3/uL (ref 0.0–0.7)
Eosinophil %: 3.1 %
HCT: 18.2 % — ABNORMAL LOW (ref 35.0–47.0)
HGB: 6.6 g/dL — ABNORMAL LOW (ref 12.0–16.0)
Lymphocyte #: 1.4 10*3/uL (ref 1.0–3.6)
Lymphocyte %: 14.9 %
MCHC: 36.2 g/dL — ABNORMAL HIGH (ref 32.0–36.0)
Neutrophil #: 6.9 10*3/uL — ABNORMAL HIGH (ref 1.4–6.5)
RBC: 2.32 10*6/uL — ABNORMAL LOW (ref 3.80–5.20)
RDW: 23.8 % — ABNORMAL HIGH (ref 11.5–14.5)
WBC: 9.7 10*3/uL (ref 3.6–11.0)

## 2012-07-10 LAB — IRON AND TIBC
Iron Saturation: 30 %
Unbound Iron-Bind.Cap.: 142 ug/dL

## 2012-07-10 LAB — FOLATE: Folic Acid: 28 ng/mL (ref 3.1–100.0)

## 2012-07-11 LAB — CBC WITH DIFFERENTIAL/PLATELET
Basophil #: 0.1 10*3/uL (ref 0.0–0.1)
Eosinophil %: 2.6 %
Lymphocyte #: 1.4 10*3/uL (ref 1.0–3.6)
MCH: 28.4 pg (ref 26.0–34.0)
MCHC: 36.1 g/dL — ABNORMAL HIGH (ref 32.0–36.0)
Monocyte %: 7.5 %
Neutrophil %: 76.5 %
Platelet: 45 10*3/uL — ABNORMAL LOW (ref 150–440)
RBC: 2.76 10*6/uL — ABNORMAL LOW (ref 3.80–5.20)
RDW: 21.4 % — ABNORMAL HIGH (ref 11.5–14.5)
WBC: 10.8 10*3/uL (ref 3.6–11.0)

## 2012-07-11 LAB — BASIC METABOLIC PANEL
Anion Gap: 11 (ref 7–16)
BUN: 45 mg/dL — ABNORMAL HIGH (ref 7–18)
Calcium, Total: 8.5 mg/dL (ref 8.5–10.1)
Chloride: 102 mmol/L (ref 98–107)
Creatinine: 3.74 mg/dL — ABNORMAL HIGH (ref 0.60–1.30)
EGFR (Non-African Amer.): 12 — ABNORMAL LOW
Glucose: 90 mg/dL (ref 65–99)
Osmolality: 274 (ref 275–301)
Potassium: 4.2 mmol/L (ref 3.5–5.1)

## 2012-07-12 LAB — CBC WITH DIFFERENTIAL/PLATELET
Lymphocytes: 9 %
MCHC: 36.6 g/dL — ABNORMAL HIGH (ref 32.0–36.0)
Platelet: 52 10*3/uL — ABNORMAL LOW (ref 150–440)
RBC: 2.58 10*6/uL — ABNORMAL LOW (ref 3.80–5.20)
RDW: 20.7 % — ABNORMAL HIGH (ref 11.5–14.5)
Segmented Neutrophils: 82 %
WBC: 11 10*3/uL (ref 3.6–11.0)

## 2012-07-12 LAB — BASIC METABOLIC PANEL
Calcium, Total: 8.1 mg/dL — ABNORMAL LOW (ref 8.5–10.1)
EGFR (African American): 12 — ABNORMAL LOW
EGFR (Non-African Amer.): 11 — ABNORMAL LOW
Glucose: 105 mg/dL — ABNORMAL HIGH (ref 65–99)
Osmolality: 269 (ref 275–301)
Sodium: 127 mmol/L — ABNORMAL LOW (ref 136–145)

## 2012-07-13 LAB — COMPREHENSIVE METABOLIC PANEL
Anion Gap: 13 (ref 7–16)
BUN: 58 mg/dL — ABNORMAL HIGH (ref 7–18)
Bilirubin,Total: 1.6 mg/dL — ABNORMAL HIGH (ref 0.2–1.0)
Calcium, Total: 8.1 mg/dL — ABNORMAL LOW (ref 8.5–10.1)
Chloride: 96 mmol/L — ABNORMAL LOW (ref 98–107)
Creatinine: 4.56 mg/dL — ABNORMAL HIGH (ref 0.60–1.30)
EGFR (African American): 11 — ABNORMAL LOW
Glucose: 92 mg/dL (ref 65–99)
Osmolality: 267 (ref 275–301)
Potassium: 4.6 mmol/L (ref 3.5–5.1)
SGPT (ALT): 64 U/L (ref 12–78)
Total Protein: 6.3 g/dL — ABNORMAL LOW (ref 6.4–8.2)

## 2012-07-13 LAB — CBC WITH DIFFERENTIAL/PLATELET
Basophil %: 0.4 %
Eosinophil #: 0 10*3/uL (ref 0.0–0.7)
Eosinophil %: 0.1 %
HGB: 7.2 g/dL — ABNORMAL LOW (ref 12.0–16.0)
Lymphocyte %: 10.5 %
MCHC: 36.2 g/dL — ABNORMAL HIGH (ref 32.0–36.0)
MCV: 78 fL — ABNORMAL LOW (ref 80–100)
Neutrophil %: 84.5 %
Platelet: 48 10*3/uL — ABNORMAL LOW (ref 150–440)
RDW: 20.9 % — ABNORMAL HIGH (ref 11.5–14.5)

## 2012-07-14 LAB — CBC WITH DIFFERENTIAL/PLATELET
Basophil %: 0.2 %
Eosinophil %: 0.1 %
HCT: 19.8 % — ABNORMAL LOW (ref 35.0–47.0)
HGB: 7.2 g/dL — ABNORMAL LOW (ref 12.0–16.0)
Lymphocyte #: 0.7 10*3/uL — ABNORMAL LOW (ref 1.0–3.6)
MCH: 28.2 pg (ref 26.0–34.0)
MCHC: 36.2 g/dL — ABNORMAL HIGH (ref 32.0–36.0)
MCV: 78 fL — ABNORMAL LOW (ref 80–100)
Monocyte #: 0.8 x10 3/mm (ref 0.2–0.9)
Monocyte %: 7.2 %
Neutrophil #: 9 10*3/uL — ABNORMAL HIGH (ref 1.4–6.5)
Neutrophil %: 86.1 %
RBC: 2.54 10*6/uL — ABNORMAL LOW (ref 3.80–5.20)

## 2012-07-14 LAB — BASIC METABOLIC PANEL
Anion Gap: 11 (ref 7–16)
BUN: 43 mg/dL — ABNORMAL HIGH (ref 7–18)
Calcium, Total: 7.7 mg/dL — ABNORMAL LOW (ref 8.5–10.1)
Creatinine: 4.1 mg/dL — ABNORMAL HIGH (ref 0.60–1.30)
EGFR (African American): 13 — ABNORMAL LOW
EGFR (Non-African Amer.): 11 — ABNORMAL LOW
Glucose: 109 mg/dL — ABNORMAL HIGH (ref 65–99)
Osmolality: 278 (ref 275–301)

## 2012-07-14 LAB — PHOSPHORUS: Phosphorus: 3.5 mg/dL (ref 2.5–4.9)

## 2012-07-15 LAB — CBC WITH DIFFERENTIAL/PLATELET
Basophil %: 0.3 %
Eosinophil #: 0 10*3/uL (ref 0.0–0.7)
HCT: 18.8 % — ABNORMAL LOW (ref 35.0–47.0)
HGB: 6.7 g/dL — ABNORMAL LOW (ref 12.0–16.0)
MCH: 28.1 pg (ref 26.0–34.0)
MCHC: 35.6 g/dL (ref 32.0–36.0)
Monocyte #: 1.2 x10 3/mm — ABNORMAL HIGH (ref 0.2–0.9)
Monocyte %: 9 %
Neutrophil #: 10.9 10*3/uL — ABNORMAL HIGH (ref 1.4–6.5)
Neutrophil %: 79.4 %

## 2012-07-15 LAB — BASIC METABOLIC PANEL
Anion Gap: 9 (ref 7–16)
BUN: 36 mg/dL — ABNORMAL HIGH (ref 7–18)
Chloride: 100 mmol/L (ref 98–107)
EGFR (Non-African Amer.): 12 — ABNORMAL LOW
Glucose: 90 mg/dL (ref 65–99)
Osmolality: 280 (ref 275–301)
Sodium: 136 mmol/L (ref 136–145)

## 2012-07-17 ENCOUNTER — Ambulatory Visit: Payer: Self-pay | Admitting: Neurology

## 2012-07-17 ENCOUNTER — Inpatient Hospital Stay: Payer: Self-pay | Admitting: Internal Medicine

## 2012-07-17 LAB — PRO B NATRIURETIC PEPTIDE: B-Type Natriuretic Peptide: 34790 pg/mL — ABNORMAL HIGH (ref 0–125)

## 2012-07-17 LAB — CBC
HGB: 11.4 g/dL — ABNORMAL LOW (ref 12.0–16.0)
MCH: 28.8 pg (ref 26.0–34.0)
MCHC: 35.5 g/dL (ref 32.0–36.0)
Platelet: 69 10*3/uL — ABNORMAL LOW (ref 150–440)
RBC: 3.97 10*6/uL (ref 3.80–5.20)

## 2012-07-17 LAB — COMPREHENSIVE METABOLIC PANEL
Albumin: 3 g/dL — ABNORMAL LOW (ref 3.4–5.0)
Anion Gap: 12 (ref 7–16)
BUN: 33 mg/dL — ABNORMAL HIGH (ref 7–18)
Calcium, Total: 8.5 mg/dL (ref 8.5–10.1)
EGFR (African American): 12 — ABNORMAL LOW
Osmolality: 264 (ref 275–301)
Potassium: 3.6 mmol/L (ref 3.5–5.1)
SGOT(AST): 189 U/L — ABNORMAL HIGH (ref 15–37)

## 2012-07-18 LAB — COMPREHENSIVE METABOLIC PANEL
Albumin: 2.3 g/dL — ABNORMAL LOW (ref 3.4–5.0)
Alkaline Phosphatase: 252 U/L — ABNORMAL HIGH (ref 50–136)
Anion Gap: 10 (ref 7–16)
BUN: 48 mg/dL — ABNORMAL HIGH (ref 7–18)
Bilirubin,Total: 2.6 mg/dL — ABNORMAL HIGH (ref 0.2–1.0)
Chloride: 94 mmol/L — ABNORMAL LOW (ref 98–107)
EGFR (African American): 8 — ABNORMAL LOW
Glucose: 86 mg/dL (ref 65–99)
Osmolality: 271 (ref 275–301)
Potassium: 4 mmol/L (ref 3.5–5.1)
SGOT(AST): 85 U/L — ABNORMAL HIGH (ref 15–37)
SGPT (ALT): 69 U/L (ref 12–78)
Total Protein: 5.7 g/dL — ABNORMAL LOW (ref 6.4–8.2)

## 2012-07-18 LAB — CBC WITH DIFFERENTIAL/PLATELET
Comment - H1-Com7: NORMAL
HCT: 25.6 % — ABNORMAL LOW (ref 35.0–47.0)
Lymphocytes: 4 %
MCHC: 35.5 g/dL (ref 32.0–36.0)
MCV: 82 fL (ref 80–100)
Metamyelocyte: 2 %
Monocytes: 3 %
NRBC/100 WBC: 1 /
Platelet: 53 10*3/uL — ABNORMAL LOW (ref 150–440)
RBC: 3.12 10*6/uL — ABNORMAL LOW (ref 3.80–5.20)
RDW: 22.9 % — ABNORMAL HIGH (ref 11.5–14.5)
Segmented Neutrophils: 90 %
WBC: 16.6 10*3/uL — ABNORMAL HIGH (ref 3.6–11.0)

## 2012-07-18 LAB — CANCER ANTIGEN 19-9: CA 19-9: 448 U/mL — ABNORMAL HIGH (ref 0–35)

## 2012-07-19 LAB — CBC WITH DIFFERENTIAL/PLATELET
Basophil #: 0.1 10*3/uL (ref 0.0–0.1)
Eosinophil #: 0.1 10*3/uL (ref 0.0–0.7)
Eosinophil %: 0.6 %
Lymphocyte #: 0.6 10*3/uL — ABNORMAL LOW (ref 1.0–3.6)
Lymphocyte %: 3 %
MCHC: 35.8 g/dL (ref 32.0–36.0)
MCV: 83 fL (ref 80–100)
Monocyte %: 7.4 %
Platelet: 52 10*3/uL — ABNORMAL LOW (ref 150–440)
RDW: 27.3 % — ABNORMAL HIGH (ref 11.5–14.5)
WBC: 19.8 10*3/uL — ABNORMAL HIGH (ref 3.6–11.0)

## 2012-07-19 LAB — BASIC METABOLIC PANEL
Calcium, Total: 7.9 mg/dL — ABNORMAL LOW (ref 8.5–10.1)
Co2: 21 mmol/L (ref 21–32)
EGFR (African American): 7 — ABNORMAL LOW
EGFR (Non-African Amer.): 6 — ABNORMAL LOW
Glucose: 146 mg/dL — ABNORMAL HIGH (ref 65–99)
Osmolality: 281 (ref 275–301)

## 2012-07-20 LAB — CBC WITH DIFFERENTIAL/PLATELET
Basophil %: 0.5 %
Basophil %: 0.4 %
Eosinophil #: 0.1 10*3/uL (ref 0.0–0.7)
Eosinophil #: 0.1 10*3/uL (ref 0.0–0.7)
Eosinophil %: 0.5 %
Eosinophil %: 0.4 %
HCT: 20.3 % — ABNORMAL LOW (ref 35.0–47.0)
HCT: 20.8 % — ABNORMAL LOW (ref 35.0–47.0)
HGB: 7.4 g/dL — ABNORMAL LOW (ref 12.0–16.0)
HGB: 7.5 g/dL — ABNORMAL LOW (ref 12.0–16.0)
Lymphocyte %: 12.9 %
Lymphocyte %: 10 %
MCH: 30.2 pg (ref 26.0–34.0)
MCHC: 36.6 g/dL — ABNORMAL HIGH (ref 32.0–36.0)
MCHC: 36 g/dL (ref 32.0–36.0)
MCV: 83 fL (ref 80–100)
Monocyte #: 1.5 x10 3/mm — ABNORMAL HIGH (ref 0.2–0.9)
Monocyte %: 9.7 %
Neutrophil #: 10.4 10*3/uL — ABNORMAL HIGH (ref 1.4–6.5)
Neutrophil #: 11 10*3/uL — ABNORMAL HIGH (ref 1.4–6.5)
Neutrophil %: 76.4 %
RBC: 2.44 10*6/uL — ABNORMAL LOW (ref 3.80–5.20)
RBC: 2.48 10*6/uL — ABNORMAL LOW (ref 3.80–5.20)
RDW: 28 % — ABNORMAL HIGH (ref 11.5–14.5)
RDW: 28.2 % — ABNORMAL HIGH (ref 11.5–14.5)
WBC: 13.4 10*3/uL — ABNORMAL HIGH (ref 3.6–11.0)
WBC: 14 10*3/uL — ABNORMAL HIGH (ref 3.6–11.0)

## 2012-07-21 LAB — PHOSPHORUS: Phosphorus: 2.5 mg/dL (ref 2.5–4.9)

## 2012-07-21 LAB — CBC WITH DIFFERENTIAL/PLATELET
HGB: 7.6 g/dL — ABNORMAL LOW (ref 12.0–16.0)
Lymphocytes: 2 %
MCH: 30.5 pg (ref 26.0–34.0)
MCHC: 35.1 g/dL (ref 32.0–36.0)
Platelet: 65 10*3/uL — ABNORMAL LOW (ref 150–440)
RDW: 28.2 % — ABNORMAL HIGH (ref 11.5–14.5)
Segmented Neutrophils: 89 %

## 2012-07-21 LAB — RENAL FUNCTION PANEL
Albumin: 2.2 g/dL — ABNORMAL LOW (ref 3.4–5.0)
BUN: 36 mg/dL — ABNORMAL HIGH (ref 7–18)
Calcium, Total: 7.7 mg/dL — ABNORMAL LOW (ref 8.5–10.1)
Chloride: 93 mmol/L — ABNORMAL LOW (ref 98–107)
Co2: 27 mmol/L (ref 21–32)
EGFR (African American): 8 — ABNORMAL LOW
Phosphorus: 2.6 mg/dL (ref 2.5–4.9)
Potassium: 3.9 mmol/L (ref 3.5–5.1)
Sodium: 131 mmol/L — ABNORMAL LOW (ref 136–145)

## 2012-07-22 ENCOUNTER — Ambulatory Visit: Payer: Self-pay | Admitting: Oncology

## 2012-07-22 LAB — CBC WITH DIFFERENTIAL/PLATELET
Basophil #: 0.1 10*3/uL (ref 0.0–0.1)
Basophil %: 1 %
Eosinophil #: 0.1 10*3/uL (ref 0.0–0.7)
Eosinophil %: 0.9 %
HCT: 19.3 % — ABNORMAL LOW (ref 35.0–47.0)
HGB: 6.9 g/dL — ABNORMAL LOW (ref 12.0–16.0)
Lymphocyte #: 1.4 10*3/uL (ref 1.0–3.6)
MCH: 30.8 pg (ref 26.0–34.0)
MCHC: 35.9 g/dL (ref 32.0–36.0)
MCV: 86 fL (ref 80–100)
Monocyte #: 1.2 x10 3/mm — ABNORMAL HIGH (ref 0.2–0.9)
Monocyte %: 9.7 %
Neutrophil %: 76.9 %
Platelet: 62 10*3/uL — ABNORMAL LOW (ref 150–440)
RBC: 2.25 10*6/uL — ABNORMAL LOW (ref 3.80–5.20)
RDW: 30.5 % — ABNORMAL HIGH (ref 11.5–14.5)

## 2012-07-23 LAB — BASIC METABOLIC PANEL
BUN: 26 mg/dL — ABNORMAL HIGH (ref 7–18)
Calcium, Total: 7.9 mg/dL — ABNORMAL LOW (ref 8.5–10.1)
Co2: 28 mmol/L (ref 21–32)
Creatinine: 5.29 mg/dL — ABNORMAL HIGH (ref 0.60–1.30)
EGFR (African American): 9 — ABNORMAL LOW
EGFR (Non-African Amer.): 8 — ABNORMAL LOW
Osmolality: 272 (ref 275–301)
Potassium: 3.8 mmol/L (ref 3.5–5.1)
Sodium: 132 mmol/L — ABNORMAL LOW (ref 136–145)

## 2012-07-23 LAB — CBC WITH DIFFERENTIAL/PLATELET
Basophil #: 0 10*3/uL (ref 0.0–0.1)
HGB: 7.8 g/dL — ABNORMAL LOW (ref 12.0–16.0)
Lymphocyte #: 1.5 10*3/uL (ref 1.0–3.6)
Lymphocyte %: 11.3 %
MCH: 30.3 pg (ref 26.0–34.0)
MCV: 89 fL (ref 80–100)
Monocyte #: 1.2 x10 3/mm — ABNORMAL HIGH (ref 0.2–0.9)
Monocyte %: 9.5 %
Neutrophil #: 10.2 10*3/uL — ABNORMAL HIGH (ref 1.4–6.5)
Neutrophil %: 78.1 %
Platelet: 63 10*3/uL — ABNORMAL LOW (ref 150–440)
RBC: 2.56 10*6/uL — ABNORMAL LOW (ref 3.80–5.20)
WBC: 13.1 10*3/uL — ABNORMAL HIGH (ref 3.6–11.0)

## 2012-07-25 LAB — BASIC METABOLIC PANEL
Anion Gap: 10 (ref 7–16)
BUN: 21 mg/dL — ABNORMAL HIGH (ref 7–18)
Chloride: 92 mmol/L — ABNORMAL LOW (ref 98–107)
Co2: 28 mmol/L (ref 21–32)
Creatinine: 4.83 mg/dL — ABNORMAL HIGH (ref 0.60–1.30)
EGFR (African American): 11 — ABNORMAL LOW
EGFR (Non-African Amer.): 9 — ABNORMAL LOW
Osmolality: 263 (ref 275–301)
Potassium: 4 mmol/L (ref 3.5–5.1)
Sodium: 130 mmol/L — ABNORMAL LOW (ref 136–145)

## 2012-07-25 LAB — CBC WITH DIFFERENTIAL/PLATELET
Basophil #: 0 10*3/uL (ref 0.0–0.1)
Eosinophil #: 0.1 10*3/uL (ref 0.0–0.7)
HCT: 21.2 % — ABNORMAL LOW (ref 35.0–47.0)
Lymphocyte %: 8.1 %
MCH: 31.1 pg (ref 26.0–34.0)
MCHC: 35.1 g/dL (ref 32.0–36.0)
Monocyte %: 8.3 %
Neutrophil #: 12.7 10*3/uL — ABNORMAL HIGH (ref 1.4–6.5)
Neutrophil %: 82.9 %
Platelet: 71 10*3/uL — ABNORMAL LOW (ref 150–440)
RDW: 21.9 % — ABNORMAL HIGH (ref 11.5–14.5)
WBC: 15.3 10*3/uL — ABNORMAL HIGH (ref 3.6–11.0)

## 2012-07-26 LAB — PHOSPHORUS: Phosphorus: 2.1 mg/dL — ABNORMAL LOW (ref 2.5–4.9)

## 2012-07-27 LAB — CBC WITH DIFFERENTIAL/PLATELET
Basophil #: 0 10*3/uL (ref 0.0–0.1)
Eosinophil %: 0.6 %
HCT: 19.7 % — ABNORMAL LOW (ref 35.0–47.0)
HGB: 6.9 g/dL — ABNORMAL LOW (ref 12.0–16.0)
Lymphocyte #: 0.9 10*3/uL — ABNORMAL LOW (ref 1.0–3.6)
MCH: 31.6 pg (ref 26.0–34.0)
MCV: 90 fL (ref 80–100)
Monocyte #: 1.2 x10 3/mm — ABNORMAL HIGH (ref 0.2–0.9)
Monocyte %: 10.6 %
Neutrophil #: 8.6 10*3/uL — ABNORMAL HIGH (ref 1.4–6.5)
Platelet: 96 10*3/uL — ABNORMAL LOW (ref 150–440)
RBC: 2.19 10*6/uL — ABNORMAL LOW (ref 3.80–5.20)
RDW: 20.4 % — ABNORMAL HIGH (ref 11.5–14.5)
WBC: 10.8 10*3/uL (ref 3.6–11.0)

## 2012-07-27 LAB — BASIC METABOLIC PANEL
Anion Gap: 6 — ABNORMAL LOW (ref 7–16)
BUN: 10 mg/dL (ref 7–18)
Chloride: 97 mmol/L — ABNORMAL LOW (ref 98–107)
Co2: 32 mmol/L (ref 21–32)
EGFR (African American): 19 — ABNORMAL LOW
Osmolality: 268 (ref 275–301)
Potassium: 3.9 mmol/L (ref 3.5–5.1)

## 2012-07-27 LAB — ALBUMIN: Albumin: 2.1 g/dL — ABNORMAL LOW (ref 3.4–5.0)

## 2012-07-27 LAB — PHENYTOIN LEVEL, TOTAL: Dilantin: 3.7 ug/mL — ABNORMAL LOW (ref 10.0–20.0)

## 2012-07-28 LAB — CBC WITH DIFFERENTIAL/PLATELET
Basophil #: 0 10*3/uL (ref 0.0–0.1)
Basophil %: 0.4 %
Eosinophil %: 0.7 %
HCT: 20.4 % — ABNORMAL LOW (ref 35.0–47.0)
Lymphocyte #: 1.1 10*3/uL (ref 1.0–3.6)
Lymphocyte %: 9.3 %
Monocyte #: 1 x10 3/mm — ABNORMAL HIGH (ref 0.2–0.9)
Monocyte %: 9.1 %
Neutrophil #: 9.2 10*3/uL — ABNORMAL HIGH (ref 1.4–6.5)
Platelet: 75 10*3/uL — ABNORMAL LOW (ref 150–440)
RBC: 2.27 10*6/uL — ABNORMAL LOW (ref 3.80–5.20)
RDW: 19.8 % — ABNORMAL HIGH (ref 11.5–14.5)
WBC: 11.5 10*3/uL — ABNORMAL HIGH (ref 3.6–11.0)

## 2012-07-29 LAB — CBC WITH DIFFERENTIAL/PLATELET
Basophil #: 0 10*3/uL (ref 0.0–0.1)
Basophil %: 0.2 %
Eosinophil %: 0.8 %
HCT: 22.1 % — ABNORMAL LOW (ref 35.0–47.0)
Lymphocyte #: 1.1 10*3/uL (ref 1.0–3.6)
Lymphocyte %: 8.9 %
MCHC: 34.8 g/dL (ref 32.0–36.0)
MCV: 92 fL (ref 80–100)
Monocyte #: 1.2 x10 3/mm — ABNORMAL HIGH (ref 0.2–0.9)
Monocyte %: 10.6 %
Neutrophil #: 9.4 10*3/uL — ABNORMAL HIGH (ref 1.4–6.5)
Neutrophil %: 79.5 %
Platelet: 75 10*3/uL — ABNORMAL LOW (ref 150–440)
RBC: 2.41 10*6/uL — ABNORMAL LOW (ref 3.80–5.20)
WBC: 11.8 10*3/uL — ABNORMAL HIGH (ref 3.6–11.0)

## 2012-07-29 LAB — RENAL FUNCTION PANEL
Calcium, Total: 7.8 mg/dL — ABNORMAL LOW (ref 8.5–10.1)
Chloride: 95 mmol/L — ABNORMAL LOW (ref 98–107)
Co2: 28 mmol/L (ref 21–32)
EGFR (African American): 12 — ABNORMAL LOW
EGFR (Non-African Amer.): 10 — ABNORMAL LOW
Osmolality: 268 (ref 275–301)
Phosphorus: 2.7 mg/dL (ref 2.5–4.9)
Potassium: 4.3 mmol/L (ref 3.5–5.1)
Sodium: 133 mmol/L — ABNORMAL LOW (ref 136–145)

## 2012-07-30 LAB — RENAL FUNCTION PANEL
Albumin: 2.3 g/dL — ABNORMAL LOW (ref 3.4–5.0)
Anion Gap: 6 — ABNORMAL LOW (ref 7–16)
BUN: 13 mg/dL (ref 7–18)
Calcium, Total: 7.9 mg/dL — ABNORMAL LOW (ref 8.5–10.1)
Creatinine: 3.52 mg/dL — ABNORMAL HIGH (ref 0.60–1.30)
EGFR (African American): 15 — ABNORMAL LOW
Glucose: 107 mg/dL — ABNORMAL HIGH (ref 65–99)
Osmolality: 267 (ref 275–301)
Phosphorus: 1.9 mg/dL — ABNORMAL LOW (ref 2.5–4.9)
Potassium: 4 mmol/L (ref 3.5–5.1)

## 2012-07-30 LAB — CBC WITH DIFFERENTIAL/PLATELET
Basophil %: 0.2 %
Eosinophil #: 0.1 10*3/uL (ref 0.0–0.7)
Eosinophil %: 0.9 %
HCT: 20.7 % — ABNORMAL LOW (ref 35.0–47.0)
HGB: 7.2 g/dL — ABNORMAL LOW (ref 12.0–16.0)
Lymphocyte #: 1 10*3/uL (ref 1.0–3.6)
Lymphocyte %: 10.8 %
MCV: 91 fL (ref 80–100)
Monocyte %: 11.2 %
Neutrophil #: 7.3 10*3/uL — ABNORMAL HIGH (ref 1.4–6.5)
Platelet: 66 10*3/uL — ABNORMAL LOW (ref 150–440)
RBC: 2.27 10*6/uL — ABNORMAL LOW (ref 3.80–5.20)
WBC: 9.4 10*3/uL (ref 3.6–11.0)

## 2012-07-31 LAB — COMPREHENSIVE METABOLIC PANEL
Albumin: 2.3 g/dL — ABNORMAL LOW (ref 3.4–5.0)
Alkaline Phosphatase: 217 U/L — ABNORMAL HIGH (ref 50–136)
Anion Gap: 6 — ABNORMAL LOW (ref 7–16)
BUN: 11 mg/dL (ref 7–18)
Bilirubin,Total: 1.2 mg/dL — ABNORMAL HIGH (ref 0.2–1.0)
Creatinine: 3.13 mg/dL — ABNORMAL HIGH (ref 0.60–1.30)
EGFR (Non-African Amer.): 15 — ABNORMAL LOW
Glucose: 73 mg/dL (ref 65–99)
Osmolality: 268 (ref 275–301)
Potassium: 4.2 mmol/L (ref 3.5–5.1)
Sodium: 135 mmol/L — ABNORMAL LOW (ref 136–145)
Total Protein: 5.4 g/dL — ABNORMAL LOW (ref 6.4–8.2)

## 2012-07-31 LAB — PHOSPHORUS: Phosphorus: 1.7 mg/dL — ABNORMAL LOW (ref 2.5–4.9)

## 2012-07-31 LAB — CBC WITH DIFFERENTIAL/PLATELET
Basophil %: 0.5 %
Eosinophil #: 0 10*3/uL (ref 0.0–0.7)
HCT: 20.2 % — ABNORMAL LOW (ref 35.0–47.0)
Lymphocyte %: 10.3 %
MCHC: 34.8 g/dL (ref 32.0–36.0)
Monocyte %: 11.7 %
Neutrophil #: 6.6 10*3/uL — ABNORMAL HIGH (ref 1.4–6.5)
Neutrophil %: 77.1 %
Platelet: 61 10*3/uL — ABNORMAL LOW (ref 150–440)
RBC: 2.22 10*6/uL — ABNORMAL LOW (ref 3.80–5.20)
WBC: 8.5 10*3/uL (ref 3.6–11.0)

## 2012-08-01 LAB — RENAL FUNCTION PANEL
Albumin: 2.6 g/dL — ABNORMAL LOW (ref 3.4–5.0)
Anion Gap: 9 (ref 7–16)
BUN: 29 mg/dL — ABNORMAL HIGH (ref 7–18)
Calcium, Total: 8.8 mg/dL (ref 8.5–10.1)
Chloride: 95 mmol/L — ABNORMAL LOW (ref 98–107)
Creatinine: 4.99 mg/dL — ABNORMAL HIGH (ref 0.60–1.30)
EGFR (African American): 10 — ABNORMAL LOW
EGFR (Non-African Amer.): 9 — ABNORMAL LOW
Osmolality: 271 (ref 275–301)
Phosphorus: 2.7 mg/dL (ref 2.5–4.9)
Potassium: 3.9 mmol/L (ref 3.5–5.1)

## 2012-08-03 LAB — RENAL FUNCTION PANEL
Albumin: 2.2 g/dL — ABNORMAL LOW (ref 3.4–5.0)
Anion Gap: 5 — ABNORMAL LOW (ref 7–16)
BUN: 21 mg/dL — ABNORMAL HIGH (ref 7–18)
Calcium, Total: 7.9 mg/dL — ABNORMAL LOW (ref 8.5–10.1)
Chloride: 94 mmol/L — ABNORMAL LOW (ref 98–107)
Creatinine: 4.77 mg/dL — ABNORMAL HIGH (ref 0.60–1.30)
EGFR (African American): 11 — ABNORMAL LOW
EGFR (Non-African Amer.): 9 — ABNORMAL LOW
Glucose: 61 mg/dL — ABNORMAL LOW (ref 65–99)
Osmolality: 262 (ref 275–301)

## 2012-08-03 LAB — CBC WITH DIFFERENTIAL/PLATELET
Basophil %: 0.5 %
Eosinophil %: 1 %
HGB: 6.7 g/dL — ABNORMAL LOW (ref 12.0–16.0)
Lymphocyte %: 13.3 %
MCH: 31.1 pg (ref 26.0–34.0)
MCV: 91 fL (ref 80–100)
Monocyte #: 1 x10 3/mm — ABNORMAL HIGH (ref 0.2–0.9)
Monocyte %: 12.1 %
Neutrophil %: 73.1 %
Platelet: 63 10*3/uL — ABNORMAL LOW (ref 150–440)
RBC: 2.14 10*6/uL — ABNORMAL LOW (ref 3.80–5.20)
RDW: 16.2 % — ABNORMAL HIGH (ref 11.5–14.5)
WBC: 8 10*3/uL (ref 3.6–11.0)

## 2012-08-05 LAB — RENAL FUNCTION PANEL
Albumin: 2.3 g/dL — ABNORMAL LOW (ref 3.4–5.0)
Anion Gap: 7 (ref 7–16)
Calcium, Total: 8 mg/dL — ABNORMAL LOW (ref 8.5–10.1)
Chloride: 95 mmol/L — ABNORMAL LOW (ref 98–107)
Co2: 28 mmol/L (ref 21–32)
Phosphorus: 2.4 mg/dL — ABNORMAL LOW (ref 2.5–4.9)

## 2012-08-08 LAB — CBC WITH DIFFERENTIAL/PLATELET
Basophil %: 0.5 %
Eosinophil #: 0.1 10*3/uL (ref 0.0–0.7)
Eosinophil %: 0.9 %
HCT: 24 % — ABNORMAL LOW (ref 35.0–47.0)
HGB: 8.5 g/dL — ABNORMAL LOW (ref 12.0–16.0)
Lymphocyte %: 6.3 %
MCH: 31.9 pg (ref 26.0–34.0)
MCHC: 35.2 g/dL (ref 32.0–36.0)
Monocyte #: 1.2 x10 3/mm — ABNORMAL HIGH (ref 0.2–0.9)
Neutrophil #: 8.7 10*3/uL — ABNORMAL HIGH (ref 1.4–6.5)
Neutrophil %: 81.2 %
RDW: 15.1 % — ABNORMAL HIGH (ref 11.5–14.5)

## 2012-08-08 LAB — RENAL FUNCTION PANEL
Albumin: 2.2 g/dL — ABNORMAL LOW (ref 3.4–5.0)
Calcium, Total: 7.8 mg/dL — ABNORMAL LOW (ref 8.5–10.1)
Chloride: 92 mmol/L — ABNORMAL LOW (ref 98–107)
Co2: 28 mmol/L (ref 21–32)
Creatinine: 6.01 mg/dL — ABNORMAL HIGH (ref 0.60–1.30)
EGFR (African American): 8 — ABNORMAL LOW
EGFR (Non-African Amer.): 7 — ABNORMAL LOW
Osmolality: 258 (ref 275–301)
Potassium: 5 mmol/L (ref 3.5–5.1)

## 2012-08-09 LAB — PHENYTOIN LEVEL, TOTAL: Dilantin: 5.4 ug/mL — ABNORMAL LOW (ref 10.0–20.0)

## 2012-08-15 LAB — CBC CANCER CENTER
Basophil %: 0.4 %
Eosinophil #: 0.1 x10 3/mm (ref 0.0–0.7)
Eosinophil %: 0.5 %
HCT: 27.5 % — ABNORMAL LOW (ref 35.0–47.0)
HGB: 9.3 g/dL — ABNORMAL LOW (ref 12.0–16.0)
Lymphocyte #: 1.1 x10 3/mm (ref 1.0–3.6)
MCH: 30.7 pg (ref 26.0–34.0)
MCHC: 33.8 g/dL (ref 32.0–36.0)
Monocyte #: 1.3 x10 3/mm — ABNORMAL HIGH (ref 0.2–0.9)
Neutrophil #: 8.6 x10 3/mm — ABNORMAL HIGH (ref 1.4–6.5)
RBC: 3.03 10*6/uL — ABNORMAL LOW (ref 3.80–5.20)

## 2012-08-15 LAB — BASIC METABOLIC PANEL
Anion Gap: 10 (ref 7–16)
BUN: 21 mg/dL — ABNORMAL HIGH (ref 7–18)
Calcium, Total: 8.8 mg/dL (ref 8.5–10.1)
Chloride: 97 mmol/L — ABNORMAL LOW (ref 98–107)
EGFR (Non-African Amer.): 8 — ABNORMAL LOW
Osmolality: 278 (ref 275–301)
Potassium: 4.4 mmol/L (ref 3.5–5.1)
Sodium: 137 mmol/L (ref 136–145)

## 2012-08-21 ENCOUNTER — Ambulatory Visit: Payer: Self-pay | Admitting: Oncology

## 2012-08-24 LAB — CBC CANCER CENTER
Basophil %: 0.6 %
HCT: 22.1 % — ABNORMAL LOW (ref 35.0–47.0)
HGB: 7.6 g/dL — ABNORMAL LOW (ref 12.0–16.0)
Lymphocyte %: 9.8 %
MCHC: 34.3 g/dL (ref 32.0–36.0)
Monocyte %: 15.2 %
Neutrophil #: 8.3 x10 3/mm — ABNORMAL HIGH (ref 1.4–6.5)
RBC: 2.5 10*6/uL — ABNORMAL LOW (ref 3.80–5.20)

## 2012-08-24 LAB — COMPREHENSIVE METABOLIC PANEL
Albumin: 2.2 g/dL — ABNORMAL LOW (ref 3.4–5.0)
Alkaline Phosphatase: 339 U/L — ABNORMAL HIGH (ref 50–136)
Anion Gap: 11 (ref 7–16)
BUN: 17 mg/dL (ref 7–18)
Calcium, Total: 8.1 mg/dL — ABNORMAL LOW (ref 8.5–10.1)
Chloride: 97 mmol/L — ABNORMAL LOW (ref 98–107)
Creatinine: 4.6 mg/dL — ABNORMAL HIGH (ref 0.60–1.30)
SGPT (ALT): 36 U/L (ref 12–78)
Total Protein: 5.7 g/dL — ABNORMAL LOW (ref 6.4–8.2)

## 2012-08-31 ENCOUNTER — Ambulatory Visit: Payer: Self-pay | Admitting: Vascular Surgery

## 2012-08-31 LAB — BASIC METABOLIC PANEL
Anion Gap: 7 (ref 7–16)
BUN: 13 mg/dL (ref 7–18)
Chloride: 98 mmol/L (ref 98–107)
Co2: 30 mmol/L (ref 21–32)
Creatinine: 4.31 mg/dL — ABNORMAL HIGH (ref 0.60–1.30)
EGFR (Non-African Amer.): 10 — ABNORMAL LOW
Glucose: 152 mg/dL — ABNORMAL HIGH (ref 65–99)
Potassium: 3.8 mmol/L (ref 3.5–5.1)
Sodium: 135 mmol/L — ABNORMAL LOW (ref 136–145)

## 2012-08-31 LAB — CBC
HGB: 7.7 g/dL — ABNORMAL LOW (ref 12.0–16.0)
MCH: 30.9 pg (ref 26.0–34.0)
MCHC: 35.2 g/dL (ref 32.0–36.0)
MCV: 88 fL (ref 80–100)
RDW: 14.8 % — ABNORMAL HIGH (ref 11.5–14.5)

## 2012-08-31 LAB — IRON AND TIBC
Iron Saturation: 23 %
Iron: 30 ug/dL — ABNORMAL LOW (ref 50–170)

## 2012-08-31 LAB — FERRITIN: Ferritin (ARMC): 1531 ng/mL — ABNORMAL HIGH (ref 8–388)

## 2012-08-31 LAB — CANCER CENTER HEMOGLOBIN: HGB: 7.9 g/dL — ABNORMAL LOW (ref 12.0–16.0)

## 2012-09-07 LAB — COMPREHENSIVE METABOLIC PANEL
Albumin: 2.3 g/dL — ABNORMAL LOW (ref 3.4–5.0)
Anion Gap: 8 (ref 7–16)
BUN: 11 mg/dL (ref 7–18)
Bilirubin,Total: 2.1 mg/dL — ABNORMAL HIGH (ref 0.2–1.0)
Co2: 31 mmol/L (ref 21–32)
EGFR (African American): 12 — ABNORMAL LOW
EGFR (Non-African Amer.): 10 — ABNORMAL LOW
Glucose: 121 mg/dL — ABNORMAL HIGH (ref 65–99)
Osmolality: 273 (ref 275–301)
Potassium: 3.7 mmol/L (ref 3.5–5.1)
Sodium: 136 mmol/L (ref 136–145)
Total Protein: 6.2 g/dL — ABNORMAL LOW (ref 6.4–8.2)

## 2012-09-07 LAB — CBC CANCER CENTER
Basophil #: 0.1 x10 3/mm (ref 0.0–0.1)
Eosinophil %: 1.4 %
HCT: 24.1 % — ABNORMAL LOW (ref 35.0–47.0)
HGB: 8.3 g/dL — ABNORMAL LOW (ref 12.0–16.0)
Lymphocyte #: 1 x10 3/mm (ref 1.0–3.6)
MCV: 88 fL (ref 80–100)
Monocyte #: 0.9 x10 3/mm (ref 0.2–0.9)
Monocyte %: 15.4 %
Neutrophil %: 65.5 %
Platelet: 198 x10 3/mm (ref 150–440)
RBC: 2.75 10*6/uL — ABNORMAL LOW (ref 3.80–5.20)
RDW: 15.9 % — ABNORMAL HIGH (ref 11.5–14.5)
WBC: 6.1 x10 3/mm (ref 3.6–11.0)

## 2012-09-08 ENCOUNTER — Ambulatory Visit: Payer: Self-pay | Admitting: Vascular Surgery

## 2012-09-19 ENCOUNTER — Inpatient Hospital Stay: Payer: Self-pay

## 2012-09-19 LAB — URINALYSIS, COMPLETE
Bilirubin,UR: NEGATIVE
Glucose,UR: NEGATIVE mg/dL (ref 0–75)
Nitrite: NEGATIVE
RBC,UR: 24 /HPF (ref 0–5)
Squamous Epithelial: 2
WBC UR: 679 /HPF (ref 0–5)

## 2012-09-19 LAB — COMPREHENSIVE METABOLIC PANEL
Albumin: 2.1 g/dL — ABNORMAL LOW (ref 3.4–5.0)
Alkaline Phosphatase: 381 U/L — ABNORMAL HIGH (ref 50–136)
Anion Gap: 8 (ref 7–16)
BUN: 16 mg/dL (ref 7–18)
Chloride: 93 mmol/L — ABNORMAL LOW (ref 98–107)
Creatinine: 4.46 mg/dL — ABNORMAL HIGH (ref 0.60–1.30)
Glucose: 110 mg/dL — ABNORMAL HIGH (ref 65–99)
Osmolality: 266 (ref 275–301)
SGPT (ALT): 16 U/L (ref 12–78)

## 2012-09-19 LAB — CBC WITH DIFFERENTIAL/PLATELET
Basophil %: 0.4 %
Eosinophil #: 0.1 10*3/uL (ref 0.0–0.7)
Eosinophil %: 1.1 %
HCT: 25.5 % — ABNORMAL LOW (ref 35.0–47.0)
HGB: 9.1 g/dL — ABNORMAL LOW (ref 12.0–16.0)
Lymphocyte %: 13.3 %
MCH: 30.6 pg (ref 26.0–34.0)
MCHC: 35.8 g/dL (ref 32.0–36.0)
Monocyte #: 0.4 x10 3/mm (ref 0.2–0.9)
Neutrophil #: 5.6 10*3/uL (ref 1.4–6.5)
WBC: 7 10*3/uL (ref 3.6–11.0)

## 2012-09-19 LAB — LIPASE, BLOOD: Lipase: 65 U/L — ABNORMAL LOW (ref 73–393)

## 2012-09-19 LAB — PHOSPHORUS: Phosphorus: 2.4 mg/dL — ABNORMAL LOW (ref 2.5–4.9)

## 2012-09-20 LAB — CBC WITH DIFFERENTIAL/PLATELET
Basophil #: 0 10*3/uL (ref 0.0–0.1)
Eosinophil #: 0.1 10*3/uL (ref 0.0–0.7)
Eosinophil %: 1.4 %
HCT: 21.9 % — ABNORMAL LOW (ref 35.0–47.0)
Lymphocyte #: 0.9 10*3/uL — ABNORMAL LOW (ref 1.0–3.6)
Lymphocyte %: 14.6 %
MCH: 29.4 pg (ref 26.0–34.0)
MCV: 87 fL (ref 80–100)
Monocyte #: 0.5 x10 3/mm (ref 0.2–0.9)
RBC: 2.51 10*6/uL — ABNORMAL LOW (ref 3.80–5.20)
RDW: 16.3 % — ABNORMAL HIGH (ref 11.5–14.5)
WBC: 6.3 10*3/uL (ref 3.6–11.0)

## 2012-09-20 LAB — BASIC METABOLIC PANEL
Anion Gap: 5 — ABNORMAL LOW (ref 7–16)
BUN: 11 mg/dL (ref 7–18)
Calcium, Total: 7.5 mg/dL — ABNORMAL LOW (ref 8.5–10.1)
Co2: 32 mmol/L (ref 21–32)
Potassium: 3.6 mmol/L (ref 3.5–5.1)
Sodium: 133 mmol/L — ABNORMAL LOW (ref 136–145)

## 2012-09-21 ENCOUNTER — Ambulatory Visit: Payer: Self-pay | Admitting: Oncology

## 2012-09-21 LAB — BASIC METABOLIC PANEL
Anion Gap: 6 — ABNORMAL LOW (ref 7–16)
BUN: 6 mg/dL — ABNORMAL LOW (ref 7–18)
Calcium, Total: 7.3 mg/dL — ABNORMAL LOW (ref 8.5–10.1)
Chloride: 98 mmol/L (ref 98–107)
Co2: 33 mmol/L — ABNORMAL HIGH (ref 21–32)
Creatinine: 3.19 mg/dL — ABNORMAL HIGH (ref 0.60–1.30)
Glucose: 96 mg/dL (ref 65–99)

## 2012-09-21 LAB — CBC WITH DIFFERENTIAL/PLATELET
Eosinophil %: 1.6 %
HCT: 21.7 % — ABNORMAL LOW (ref 35.0–47.0)
Lymphocyte #: 0.8 10*3/uL — ABNORMAL LOW (ref 1.0–3.6)
Lymphocyte %: 14.2 %
MCH: 27.7 pg (ref 26.0–34.0)
MCHC: 32.1 g/dL (ref 32.0–36.0)
Neutrophil #: 3.9 10*3/uL (ref 1.4–6.5)
Platelet: 136 10*3/uL — ABNORMAL LOW (ref 150–440)
RBC: 2.51 10*6/uL — ABNORMAL LOW (ref 3.80–5.20)
RDW: 16.9 % — ABNORMAL HIGH (ref 11.5–14.5)
WBC: 5.4 10*3/uL (ref 3.6–11.0)

## 2012-09-22 LAB — CBC WITH DIFFERENTIAL/PLATELET
Basophil %: 0.8 %
Eosinophil %: 2.5 %
HCT: 20.4 % — ABNORMAL LOW (ref 35.0–47.0)
HGB: 6.5 g/dL — ABNORMAL LOW (ref 12.0–16.0)
Lymphocyte %: 13.3 %
MCHC: 31.8 g/dL — ABNORMAL LOW (ref 32.0–36.0)
MCV: 87 fL (ref 80–100)
Neutrophil %: 72 %
Platelet: 123 10*3/uL — ABNORMAL LOW (ref 150–440)
RBC: 2.34 10*6/uL — ABNORMAL LOW (ref 3.80–5.20)
RDW: 17.3 % — ABNORMAL HIGH (ref 11.5–14.5)
WBC: 5.7 10*3/uL (ref 3.6–11.0)

## 2012-09-22 LAB — COMPREHENSIVE METABOLIC PANEL
Albumin: 1.9 g/dL — ABNORMAL LOW (ref 3.4–5.0)
Alkaline Phosphatase: 382 U/L — ABNORMAL HIGH (ref 50–136)
Anion Gap: 5 — ABNORMAL LOW (ref 7–16)
Bilirubin,Total: 2.1 mg/dL — ABNORMAL HIGH (ref 0.2–1.0)
Calcium, Total: 7.5 mg/dL — ABNORMAL LOW (ref 8.5–10.1)
Chloride: 96 mmol/L — ABNORMAL LOW (ref 98–107)
Co2: 32 mmol/L (ref 21–32)
Creatinine: 4.73 mg/dL — ABNORMAL HIGH (ref 0.60–1.30)
Glucose: 108 mg/dL — ABNORMAL HIGH (ref 65–99)
SGOT(AST): 57 U/L — ABNORMAL HIGH (ref 15–37)

## 2012-09-22 LAB — PHOSPHORUS: Phosphorus: 2.5 mg/dL (ref 2.5–4.9)

## 2012-09-23 LAB — CBC WITH DIFFERENTIAL/PLATELET
Basophil #: 0 10*3/uL (ref 0.0–0.1)
Basophil %: 0.3 %
Eosinophil #: 0.1 10*3/uL (ref 0.0–0.7)
HGB: 7.4 g/dL — ABNORMAL LOW (ref 12.0–16.0)
Lymphocyte #: 0.4 10*3/uL — ABNORMAL LOW (ref 1.0–3.6)
MCH: 29.9 pg (ref 26.0–34.0)
MCHC: 34.1 g/dL (ref 32.0–36.0)
MCV: 88 fL (ref 80–100)
Monocyte %: 11.4 %
Neutrophil #: 5.6 10*3/uL (ref 1.4–6.5)
Neutrophil %: 80.3 %
Platelet: 133 10*3/uL — ABNORMAL LOW (ref 150–440)
RBC: 2.48 10*6/uL — ABNORMAL LOW (ref 3.80–5.20)
RDW: 17.5 % — ABNORMAL HIGH (ref 11.5–14.5)
WBC: 6.9 10*3/uL (ref 3.6–11.0)

## 2012-09-24 LAB — CULTURE, BLOOD (SINGLE)

## 2012-09-24 LAB — URINE CULTURE

## 2012-09-27 LAB — CULTURE, BLOOD (SINGLE)

## 2012-09-28 LAB — BASIC METABOLIC PANEL
Anion Gap: 9 (ref 7–16)
BUN: 11 mg/dL (ref 7–18)
Calcium, Total: 7.7 mg/dL — ABNORMAL LOW (ref 8.5–10.1)
Chloride: 94 mmol/L — ABNORMAL LOW (ref 98–107)
Co2: 28 mmol/L (ref 21–32)
Creatinine: 3.76 mg/dL — ABNORMAL HIGH (ref 0.60–1.30)
Glucose: 112 mg/dL — ABNORMAL HIGH (ref 65–99)
Osmolality: 263 (ref 275–301)

## 2012-09-28 LAB — CBC CANCER CENTER
Bands: 1 %
Lymphocytes: 12 %
MCV: 85 fL (ref 80–100)
Monocytes: 15 %
Platelet: 248 x10 3/mm (ref 150–440)
RBC: 2.92 10*6/uL — ABNORMAL LOW (ref 3.80–5.20)
RDW: 17.5 % — ABNORMAL HIGH (ref 11.5–14.5)
Segmented Neutrophils: 68 %
Variant Lymphocyte: 1 %
WBC: 7.1 x10 3/mm (ref 3.6–11.0)

## 2012-10-05 LAB — CBC CANCER CENTER
Lymphocytes: 21 %
MCV: 83 fL (ref 80–100)
Monocytes: 5 %
Platelet: 212 x10 3/mm (ref 150–440)
RBC: 2.99 10*6/uL — ABNORMAL LOW (ref 3.80–5.20)
RDW: 18.3 % — ABNORMAL HIGH (ref 11.5–14.5)
Segmented Neutrophils: 73 %
WBC: 6.7 x10 3/mm (ref 3.6–11.0)

## 2012-10-10 ENCOUNTER — Emergency Department: Payer: Self-pay | Admitting: Emergency Medicine

## 2012-10-10 LAB — CBC
HGB: 8.8 g/dL — ABNORMAL LOW (ref 12.0–16.0)
MCH: 29.6 pg (ref 26.0–34.0)
MCHC: 34.2 g/dL (ref 32.0–36.0)
MCV: 86 fL (ref 80–100)
Platelet: 166 10*3/uL (ref 150–440)
RBC: 2.98 10*6/uL — ABNORMAL LOW (ref 3.80–5.20)
RDW: 20.6 % — ABNORMAL HIGH (ref 11.5–14.5)

## 2012-10-10 LAB — CK TOTAL AND CKMB (NOT AT ARMC): CK, Total: 28 U/L (ref 21–215)

## 2012-10-10 LAB — COMPREHENSIVE METABOLIC PANEL
Alkaline Phosphatase: 573 U/L — ABNORMAL HIGH (ref 50–136)
Anion Gap: 8 (ref 7–16)
Calcium, Total: 7.3 mg/dL — ABNORMAL LOW (ref 8.5–10.1)
Co2: 26 mmol/L (ref 21–32)
Creatinine: 3.65 mg/dL — ABNORMAL HIGH (ref 0.60–1.30)
EGFR (Non-African Amer.): 13 — ABNORMAL LOW
Glucose: 110 mg/dL — ABNORMAL HIGH (ref 65–99)
Potassium: 3 mmol/L — ABNORMAL LOW (ref 3.5–5.1)
SGPT (ALT): 9 U/L — ABNORMAL LOW (ref 12–78)
Sodium: 129 mmol/L — ABNORMAL LOW (ref 136–145)
Total Protein: 5.3 g/dL — ABNORMAL LOW (ref 6.4–8.2)

## 2012-10-10 LAB — TROPONIN I: Troponin-I: <0.02 ng/mL

## 2012-10-12 LAB — COMPREHENSIVE METABOLIC PANEL
Albumin: 1.8 g/dL — ABNORMAL LOW (ref 3.4–5.0)
BUN: 10 mg/dL (ref 7–18)
Bilirubin,Total: 2.6 mg/dL — ABNORMAL HIGH (ref 0.2–1.0)
Calcium, Total: 7.6 mg/dL — ABNORMAL LOW (ref 8.5–10.1)
Creatinine: 3 mg/dL — ABNORMAL HIGH (ref 0.60–1.30)
EGFR (African American): 19 — ABNORMAL LOW
Glucose: 108 mg/dL — ABNORMAL HIGH (ref 65–99)
Osmolality: 266 (ref 275–301)
SGOT(AST): 60 U/L — ABNORMAL HIGH (ref 15–37)
SGPT (ALT): 12 U/L (ref 12–78)
Sodium: 133 mmol/L — ABNORMAL LOW (ref 136–145)
Total Protein: 5.8 g/dL — ABNORMAL LOW (ref 6.4–8.2)

## 2012-10-12 LAB — CBC CANCER CENTER
Basophil: 2 %
Eosinophil: 1 %
HGB: 9.1 g/dL — ABNORMAL LOW (ref 12.0–16.0)
Lymphocytes: 12 %
MCHC: 34.1 g/dL (ref 32.0–36.0)
MCV: 87 fL (ref 80–100)
Monocytes: 6 %
Platelet: 179 x10 3/mm (ref 150–440)
RBC: 3.07 10*6/uL — ABNORMAL LOW (ref 3.80–5.20)
RDW: 21.8 % — ABNORMAL HIGH (ref 11.5–14.5)
Segmented Neutrophils: 75 %
WBC: 8.8 x10 3/mm (ref 3.6–11.0)

## 2012-10-17 ENCOUNTER — Ambulatory Visit: Payer: Self-pay | Admitting: Vascular Surgery

## 2012-10-19 LAB — CBC CANCER CENTER
Basophil #: 0.1 x10 3/mm (ref 0.0–0.1)
Eosinophil %: 1 %
Lymphocyte #: 1.5 x10 3/mm (ref 1.0–3.6)
Lymphocyte %: 27.7 %
MCH: 30.2 pg (ref 26.0–34.0)
MCV: 88 fL (ref 80–100)
Monocyte %: 11.4 %
Neutrophil #: 3.2 x10 3/mm (ref 1.4–6.5)
Neutrophil %: 58.5 %
Platelet: 118 x10 3/mm — ABNORMAL LOW (ref 150–440)
RBC: 2.84 10*6/uL — ABNORMAL LOW (ref 3.80–5.20)
RDW: 21.4 % — ABNORMAL HIGH (ref 11.5–14.5)

## 2012-10-22 ENCOUNTER — Ambulatory Visit: Payer: Self-pay | Admitting: Oncology

## 2012-10-26 LAB — CBC CANCER CENTER
Comment - H1-Com4: NORMAL
MCH: 29.9 pg (ref 26.0–34.0)
MCV: 89 fL (ref 80–100)
Platelet: 185 x10 3/mm (ref 150–440)
RBC: 3.03 10*6/uL — ABNORMAL LOW (ref 3.80–5.20)
RDW: 20.9 % — ABNORMAL HIGH (ref 11.5–14.5)
Segmented Neutrophils: 80 %
WBC: 9.1 x10 3/mm (ref 3.6–11.0)

## 2012-10-26 LAB — BASIC METABOLIC PANEL
Anion Gap: 9 (ref 7–16)
BUN: 10 mg/dL (ref 7–18)
Calcium, Total: 7.4 mg/dL — ABNORMAL LOW (ref 8.5–10.1)
Co2: 28 mmol/L (ref 21–32)
Creatinine: 2.92 mg/dL — ABNORMAL HIGH (ref 0.60–1.30)
EGFR (African American): 19 — ABNORMAL LOW
Osmolality: 262 (ref 275–301)
Potassium: 2.8 mmol/L — ABNORMAL LOW (ref 3.5–5.1)
Sodium: 131 mmol/L — ABNORMAL LOW (ref 136–145)

## 2012-11-02 ENCOUNTER — Ambulatory Visit: Payer: Self-pay | Admitting: Family Medicine

## 2012-11-02 LAB — CBC CANCER CENTER
Eosinophil: 1 %
HCT: 28.4 % — ABNORMAL LOW (ref 35.0–47.0)
HGB: 9.7 g/dL — ABNORMAL LOW (ref 12.0–16.0)
Lymphocytes: 21 %
MCHC: 34 g/dL (ref 32.0–36.0)
MCV: 89 fL (ref 80–100)
Monocytes: 4 %
NRBC/100 WBC: 1 /100
Platelet: 105 x10 3/mm — ABNORMAL LOW (ref 150–440)
RBC: 3.21 10*6/uL — ABNORMAL LOW (ref 3.80–5.20)
RDW: 19.3 % — ABNORMAL HIGH (ref 11.5–14.5)
Segmented Neutrophils: 71 %
Variant Lymphocyte: 3 %
WBC: 4.3 x10 3/mm (ref 3.6–11.0)

## 2012-11-02 LAB — PROTIME-INR
INR: 1.1
Prothrombin Time: 14.3 secs (ref 11.5–14.7)

## 2012-11-02 LAB — APTT: Activated PTT: 30.6 secs (ref 23.6–35.9)

## 2012-11-05 ENCOUNTER — Inpatient Hospital Stay: Payer: Self-pay

## 2012-11-05 LAB — COMPREHENSIVE METABOLIC PANEL
Albumin: 1.6 g/dL — ABNORMAL LOW (ref 3.4–5.0)
Alkaline Phosphatase: 712 U/L — ABNORMAL HIGH (ref 50–136)
Alkaline Phosphatase: 698 U/L — ABNORMAL HIGH (ref 50–136)
Anion Gap: 9 (ref 7–16)
Bilirubin,Total: 2.6 mg/dL — ABNORMAL HIGH (ref 0.2–1.0)
Calcium, Total: 7.7 mg/dL — ABNORMAL LOW (ref 8.5–10.1)
Calcium, Total: 7.6 mg/dL — ABNORMAL LOW (ref 8.5–10.1)
Chloride: 97 mmol/L — ABNORMAL LOW (ref 98–107)
Chloride: 94 mmol/L — ABNORMAL LOW (ref 98–107)
Co2: 30 mmol/L (ref 21–32)
Co2: 27 mmol/L (ref 21–32)
Creatinine: 3.11 mg/dL — ABNORMAL HIGH (ref 0.60–1.30)
Creatinine: 3.1 mg/dL — ABNORMAL HIGH (ref 0.60–1.30)
EGFR (African American): 18 — ABNORMAL LOW
EGFR (Non-African Amer.): 16 — ABNORMAL LOW
EGFR (Non-African Amer.): 16 — ABNORMAL LOW
Glucose: 96 mg/dL (ref 65–99)
Glucose: 85 mg/dL (ref 65–99)
Osmolality: 272 (ref 275–301)
Potassium: 3.2 mmol/L — ABNORMAL LOW (ref 3.5–5.1)
SGOT(AST): 61 U/L — ABNORMAL HIGH (ref 15–37)
SGPT (ALT): 11 U/L — ABNORMAL LOW (ref 12–78)
SGPT (ALT): 12 U/L (ref 12–78)
Sodium: 131 mmol/L — ABNORMAL LOW (ref 136–145)
Total Protein: 5.1 g/dL — ABNORMAL LOW (ref 6.4–8.2)

## 2012-11-05 LAB — CBC
HCT: 30.1 % — ABNORMAL LOW (ref 35.0–47.0)
MCH: 30 pg (ref 26.0–34.0)
MCV: 90 fL (ref 80–100)
Platelet: 153 10*3/uL (ref 150–440)
RBC: 3.33 10*6/uL — ABNORMAL LOW (ref 3.80–5.20)
RDW: 20.2 % — ABNORMAL HIGH (ref 11.5–14.5)

## 2012-11-05 LAB — FERRITIN: Ferritin (ARMC): 2514 ng/mL — ABNORMAL HIGH (ref 8–388)

## 2012-11-05 LAB — CBC WITH DIFFERENTIAL/PLATELET
Basophil: 1 %
HGB: 9.5 g/dL — ABNORMAL LOW (ref 12.0–16.0)
Lymphocytes: 16 %
MCH: 29.8 pg (ref 26.0–34.0)
MCHC: 32.9 g/dL (ref 32.0–36.0)
MCV: 90 fL (ref 80–100)
RBC: 3.19 10*6/uL — ABNORMAL LOW (ref 3.80–5.20)
RDW: 20.4 % — ABNORMAL HIGH (ref 11.5–14.5)
WBC: 9.1 10*3/uL (ref 3.6–11.0)

## 2012-11-05 LAB — PROTIME-INR
INR: 1.1
Prothrombin Time: 14.8 secs — ABNORMAL HIGH (ref 11.5–14.7)

## 2012-11-05 LAB — IRON AND TIBC
Iron Bind.Cap.(Total): 84 ug/dL — ABNORMAL LOW (ref 250–450)
Iron Saturation: 30 %
Unbound Iron-Bind.Cap.: 59 ug/dL

## 2012-11-05 LAB — LIPASE, BLOOD: Lipase: 43 U/L — ABNORMAL LOW (ref 73–393)

## 2012-11-06 LAB — RENAL FUNCTION PANEL
BUN: 18 mg/dL (ref 7–18)
Creatinine: 4.03 mg/dL — ABNORMAL HIGH (ref 0.60–1.30)
EGFR (African American): 13 — ABNORMAL LOW
Glucose: 69 mg/dL (ref 65–99)
Osmolality: 263 (ref 275–301)
Phosphorus: 2 mg/dL — ABNORMAL LOW (ref 2.5–4.9)
Potassium: 3.2 mmol/L — ABNORMAL LOW (ref 3.5–5.1)
Sodium: 131 mmol/L — ABNORMAL LOW (ref 136–145)

## 2012-11-06 LAB — COMPREHENSIVE METABOLIC PANEL
Alkaline Phosphatase: 647 U/L — ABNORMAL HIGH (ref 50–136)
Anion Gap: 12 (ref 7–16)
Bilirubin,Total: 2.3 mg/dL — ABNORMAL HIGH (ref 0.2–1.0)
Calcium, Total: 7.4 mg/dL — ABNORMAL LOW (ref 8.5–10.1)
Chloride: 95 mmol/L — ABNORMAL LOW (ref 98–107)
Co2: 26 mmol/L (ref 21–32)
Creatinine: 3.94 mg/dL — ABNORMAL HIGH (ref 0.60–1.30)
EGFR (African American): 14 — ABNORMAL LOW
EGFR (Non-African Amer.): 12 — ABNORMAL LOW
Glucose: 72 mg/dL (ref 65–99)
SGOT(AST): 57 U/L — ABNORMAL HIGH (ref 15–37)
SGPT (ALT): 12 U/L (ref 12–78)
Sodium: 133 mmol/L — ABNORMAL LOW (ref 136–145)
Total Protein: 4.6 g/dL — ABNORMAL LOW (ref 6.4–8.2)

## 2012-11-06 LAB — CBC WITH DIFFERENTIAL/PLATELET
Basophil #: 0.1 10*3/uL (ref 0.0–0.1)
Eosinophil %: 0.8 %
HCT: 24.5 % — ABNORMAL LOW (ref 35.0–47.0)
HGB: 8.2 g/dL — ABNORMAL LOW (ref 12.0–16.0)
Lymphocyte %: 11.4 %
MCH: 30.1 pg (ref 26.0–34.0)
MCHC: 33.3 g/dL (ref 32.0–36.0)
MCV: 90 fL (ref 80–100)
Monocyte #: 1.1 x10 3/mm — ABNORMAL HIGH (ref 0.2–0.9)
Neutrophil #: 6 10*3/uL (ref 1.4–6.5)
Neutrophil %: 73.3 %
Platelet: 185 10*3/uL (ref 150–440)
RBC: 2.71 10*6/uL — ABNORMAL LOW (ref 3.80–5.20)
RDW: 20.5 % — ABNORMAL HIGH (ref 11.5–14.5)

## 2012-11-07 LAB — CBC WITH DIFFERENTIAL/PLATELET
Basophil: 1 %
Eosinophil: 1 %
HCT: 26.2 % — ABNORMAL LOW (ref 35.0–47.0)
Lymphocytes: 12 %
MCHC: 33.8 g/dL (ref 32.0–36.0)
MCV: 91 fL (ref 80–100)
Monocytes: 8 %
Platelet: 186 10*3/uL (ref 150–440)

## 2012-11-07 LAB — RENAL FUNCTION PANEL
Albumin: 1.4 g/dL — ABNORMAL LOW (ref 3.4–5.0)
Chloride: 95 mmol/L — ABNORMAL LOW (ref 98–107)
Co2: 30 mmol/L (ref 21–32)
Creatinine: 2.93 mg/dL — ABNORMAL HIGH (ref 0.60–1.30)
EGFR (African American): 19 — ABNORMAL LOW
EGFR (Non-African Amer.): 17 — ABNORMAL LOW
Glucose: 75 mg/dL (ref 65–99)
Potassium: 3.2 mmol/L — ABNORMAL LOW (ref 3.5–5.1)

## 2012-11-08 LAB — URINE CULTURE

## 2012-11-09 LAB — CBC CANCER CENTER
HCT: 29.3 % — ABNORMAL LOW (ref 35.0–47.0)
HGB: 9.9 g/dL — ABNORMAL LOW (ref 12.0–16.0)
MCH: 30.5 pg (ref 26.0–34.0)
MCHC: 33.8 g/dL (ref 32.0–36.0)
MCV: 90 fL (ref 80–100)
Monocytes: 7 %
NRBC/100 WBC: 1 /100
Platelet: 187 x10 3/mm (ref 150–440)
RBC: 3.24 10*6/uL — ABNORMAL LOW (ref 3.80–5.20)
Segmented Neutrophils: 80 %
WBC: 8.5 x10 3/mm (ref 3.6–11.0)

## 2012-11-09 LAB — BASIC METABOLIC PANEL
Anion Gap: 9 (ref 7–16)
BUN: 9 mg/dL (ref 7–18)
Calcium, Total: 7.6 mg/dL — ABNORMAL LOW (ref 8.5–10.1)
Chloride: 97 mmol/L — ABNORMAL LOW (ref 98–107)
Co2: 29 mmol/L (ref 21–32)
Creatinine: 3.52 mg/dL — ABNORMAL HIGH (ref 0.60–1.30)
EGFR (African American): 15 — ABNORMAL LOW
EGFR (Non-African Amer.): 13 — ABNORMAL LOW
Glucose: 113 mg/dL — ABNORMAL HIGH (ref 65–99)
Potassium: 3.2 mmol/L — ABNORMAL LOW (ref 3.5–5.1)

## 2012-11-10 LAB — COMPREHENSIVE METABOLIC PANEL
Albumin: 1.6 g/dL — ABNORMAL LOW (ref 3.4–5.0)
Anion Gap: 12 (ref 7–16)
Bilirubin,Total: 3.1 mg/dL — ABNORMAL HIGH (ref 0.2–1.0)
Calcium, Total: 7.6 mg/dL — ABNORMAL LOW (ref 8.5–10.1)
Chloride: 92 mmol/L — ABNORMAL LOW (ref 98–107)
Co2: 24 mmol/L (ref 21–32)
Creatinine: 3.93 mg/dL — ABNORMAL HIGH (ref 0.60–1.30)
EGFR (African American): 14 — ABNORMAL LOW
EGFR (Non-African Amer.): 12 — ABNORMAL LOW
Glucose: 118 mg/dL — ABNORMAL HIGH (ref 65–99)
Osmolality: 260 (ref 275–301)
Potassium: 3.5 mmol/L (ref 3.5–5.1)
SGPT (ALT): 11 U/L — ABNORMAL LOW (ref 12–78)
Sodium: 128 mmol/L — ABNORMAL LOW (ref 136–145)

## 2012-11-10 LAB — CBC WITH DIFFERENTIAL/PLATELET
Lymphocytes: 3 %
MCH: 30.1 pg (ref 26.0–34.0)
MCHC: 33.4 g/dL (ref 32.0–36.0)
Platelet: 197 10*3/uL (ref 150–440)
RBC: 3.39 10*6/uL — ABNORMAL LOW (ref 3.80–5.20)
RDW: 19.8 % — ABNORMAL HIGH (ref 11.5–14.5)
Segmented Neutrophils: 93 %

## 2012-11-10 LAB — CANCER ANTIGEN 19-9: CA 19-9: 615 U/mL — ABNORMAL HIGH (ref 0–35)

## 2012-11-10 LAB — PROTIME-INR: INR: 1.1

## 2012-11-11 LAB — BASIC METABOLIC PANEL
Anion Gap: 12 (ref 7–16)
BUN: 22 mg/dL — ABNORMAL HIGH (ref 7–18)
Calcium, Total: 7.6 mg/dL — ABNORMAL LOW (ref 8.5–10.1)
Chloride: 94 mmol/L — ABNORMAL LOW (ref 98–107)
Co2: 25 mmol/L (ref 21–32)
Creatinine: 4.41 mg/dL — ABNORMAL HIGH (ref 0.60–1.30)
EGFR (Non-African Amer.): 10 — ABNORMAL LOW
Sodium: 131 mmol/L — ABNORMAL LOW (ref 136–145)

## 2012-11-11 LAB — CBC WITH DIFFERENTIAL/PLATELET
HCT: 29.4 % — ABNORMAL LOW (ref 35.0–47.0)
HCT: 31.1 % — ABNORMAL LOW (ref 35.0–47.0)
HGB: 9.4 g/dL — ABNORMAL LOW (ref 12.0–16.0)
HGB: 10.4 g/dL — ABNORMAL LOW (ref 12.0–16.0)
Lymphocytes: 13 %
Lymphocytes: 12 %
MCH: 29.2 pg (ref 26.0–34.0)
MCH: 30.3 pg (ref 26.0–34.0)
MCHC: 31.9 g/dL — ABNORMAL LOW (ref 32.0–36.0)
MCHC: 33.4 g/dL (ref 32.0–36.0)
MCV: 92 fL (ref 80–100)
MCV: 91 fL (ref 80–100)
Platelet: 122 10*3/uL — ABNORMAL LOW (ref 150–440)
RBC: 3.21 10*6/uL — ABNORMAL LOW (ref 3.80–5.20)
RDW: 20.1 % — ABNORMAL HIGH (ref 11.5–14.5)
Segmented Neutrophils: 84 %
WBC: 6 10*3/uL (ref 3.6–11.0)

## 2012-11-11 LAB — CULTURE, BLOOD (SINGLE)

## 2012-11-11 LAB — APTT: Activated PTT: 35.3 secs (ref 23.6–35.9)

## 2012-11-12 ENCOUNTER — Inpatient Hospital Stay: Payer: Self-pay

## 2012-11-12 LAB — CBC WITH DIFFERENTIAL/PLATELET
Eosinophil: 1 %
Eosinophil: 1 %
HCT: 25.5 % — ABNORMAL LOW (ref 35.0–47.0)
HCT: 24.6 % — ABNORMAL LOW (ref 35.0–47.0)
HCT: 22.4 % — ABNORMAL LOW (ref 35.0–47.0)
HGB: 8.3 g/dL — ABNORMAL LOW (ref 12.0–16.0)
HGB: 8.3 g/dL — ABNORMAL LOW (ref 12.0–16.0)
HGB: 7.4 g/dL — ABNORMAL LOW (ref 12.0–16.0)
Lymphocytes: 23 %
Lymphocytes: 17 %
Lymphocytes: 17 %
MCH: 30.5 pg (ref 26.0–34.0)
MCHC: 32.7 g/dL (ref 32.0–36.0)
MCHC: 33.9 g/dL (ref 32.0–36.0)
MCHC: 33.2 g/dL (ref 32.0–36.0)
MCV: 90 fL (ref 80–100)
MCV: 90 fL (ref 80–100)
Platelet: 95 10*3/uL — ABNORMAL LOW (ref 150–440)
Platelet: 92 10*3/uL — ABNORMAL LOW (ref 150–440)
RBC: 2.83 10*6/uL — ABNORMAL LOW (ref 3.80–5.20)
RBC: 2.73 10*6/uL — ABNORMAL LOW (ref 3.80–5.20)
RBC: 2.48 10*6/uL — ABNORMAL LOW (ref 3.80–5.20)
RDW: 19.8 % — ABNORMAL HIGH (ref 11.5–14.5)
Segmented Neutrophils: 77 %
Segmented Neutrophils: 77 %
Variant Lymphocyte - H1-Rlymph: 4 %
WBC: 3.9 10*3/uL (ref 3.6–11.0)
WBC: 3.2 10*3/uL — ABNORMAL LOW (ref 3.6–11.0)

## 2012-11-12 LAB — BASIC METABOLIC PANEL
Anion Gap: 8 (ref 7–16)
BUN: 21 mg/dL — ABNORMAL HIGH (ref 7–18)
BUN: 22 mg/dL — ABNORMAL HIGH (ref 7–18)
Creatinine: 3.84 mg/dL — ABNORMAL HIGH (ref 0.60–1.30)
Creatinine: 3.95 mg/dL — ABNORMAL HIGH (ref 0.60–1.30)
EGFR (African American): 14 — ABNORMAL LOW
EGFR (Non-African Amer.): 12 — ABNORMAL LOW
EGFR (Non-African Amer.): 12 — ABNORMAL LOW
Glucose: 99 mg/dL (ref 65–99)
Glucose: 101 mg/dL — ABNORMAL HIGH (ref 65–99)
Potassium: 3.5 mmol/L (ref 3.5–5.1)
Potassium: 3.4 mmol/L — ABNORMAL LOW (ref 3.5–5.1)
Sodium: 133 mmol/L — ABNORMAL LOW (ref 136–145)
Sodium: 133 mmol/L — ABNORMAL LOW (ref 136–145)

## 2012-11-12 LAB — APTT: Activated PTT: 100.4 secs — ABNORMAL HIGH (ref 23.6–35.9)

## 2012-11-13 LAB — BASIC METABOLIC PANEL
Chloride: 97 mmol/L — ABNORMAL LOW (ref 98–107)
EGFR (Non-African Amer.): 16 — ABNORMAL LOW
Glucose: 88 mg/dL (ref 65–99)
Potassium: 3.2 mmol/L — ABNORMAL LOW (ref 3.5–5.1)

## 2012-11-13 LAB — CBC WITH DIFFERENTIAL/PLATELET
Basophil: 1 %
HGB: 7.3 g/dL — ABNORMAL LOW (ref 12.0–16.0)
Lymphocytes: 23 %
MCH: 30.9 pg (ref 26.0–34.0)
MCHC: 34 g/dL (ref 32.0–36.0)
MCV: 91 fL (ref 80–100)
Monocytes: 3 %
Segmented Neutrophils: 73 %
WBC: 2.2 10*3/uL — ABNORMAL LOW (ref 3.6–11.0)

## 2012-11-16 LAB — CBC CANCER CENTER
Basophil: 2 %
HCT: 25.5 % — ABNORMAL LOW (ref 35.0–47.0)
HGB: 8.6 g/dL — ABNORMAL LOW (ref 12.0–16.0)
Lymphocytes: 17 %
MCH: 30.6 pg (ref 26.0–34.0)
MCHC: 33.9 g/dL (ref 32.0–36.0)
MCV: 90 fL (ref 80–100)
Monocytes: 5 %
Platelet: 55 x10 3/mm — ABNORMAL LOW (ref 150–440)
RBC: 2.83 10*6/uL — ABNORMAL LOW (ref 3.80–5.20)
Segmented Neutrophils: 73 %
Variant Lymphocyte: 1 %

## 2012-11-19 ENCOUNTER — Ambulatory Visit: Payer: Self-pay | Admitting: Oncology

## 2012-11-23 ENCOUNTER — Ambulatory Visit: Payer: Self-pay | Admitting: Oncology

## 2012-11-23 LAB — COMPREHENSIVE METABOLIC PANEL
Albumin: 1.4 g/dL — ABNORMAL LOW (ref 3.4–5.0)
Anion Gap: 10 (ref 7–16)
Chloride: 97 mmol/L — ABNORMAL LOW (ref 98–107)
Creatinine: 4.74 mg/dL — ABNORMAL HIGH (ref 0.60–1.30)
Glucose: 105 mg/dL — ABNORMAL HIGH (ref 65–99)
SGPT (ALT): 9 U/L — ABNORMAL LOW (ref 12–78)
Total Protein: 5.3 g/dL — ABNORMAL LOW (ref 6.4–8.2)

## 2012-11-23 LAB — CBC CANCER CENTER
Basophil: 1 %
MCV: 92 fL (ref 80–100)
Monocytes: 13 %
Platelet: 190 x10 3/mm (ref 150–440)
RBC: 3.34 10*6/uL — ABNORMAL LOW (ref 3.80–5.20)
Segmented Neutrophils: 67 %
WBC: 3.8 x10 3/mm (ref 3.6–11.0)

## 2012-11-29 ENCOUNTER — Inpatient Hospital Stay: Payer: Self-pay

## 2012-11-29 LAB — CBC
MCH: 30.1 pg (ref 26.0–34.0)
MCHC: 32.3 g/dL (ref 32.0–36.0)
MCV: 93 fL (ref 80–100)
Platelet: 189 10*3/uL (ref 150–440)
RBC: 3.18 10*6/uL — ABNORMAL LOW (ref 3.80–5.20)
RDW: 19.9 % — ABNORMAL HIGH (ref 11.5–14.5)
WBC: 8.7 10*3/uL (ref 3.6–11.0)

## 2012-11-29 LAB — COMPREHENSIVE METABOLIC PANEL
Alkaline Phosphatase: 590 U/L — ABNORMAL HIGH (ref 50–136)
Anion Gap: 6 — ABNORMAL LOW (ref 7–16)
Chloride: 98 mmol/L (ref 98–107)
Co2: 30 mmol/L (ref 21–32)
EGFR (African American): 10 — ABNORMAL LOW
EGFR (Non-African Amer.): 9 — ABNORMAL LOW
Glucose: 32 mg/dL — CL (ref 65–99)
Osmolality: 268 (ref 275–301)
SGOT(AST): 54 U/L — ABNORMAL HIGH (ref 15–37)
SGPT (ALT): 12 U/L (ref 12–78)
Sodium: 134 mmol/L — ABNORMAL LOW (ref 136–145)

## 2012-11-29 LAB — MAGNESIUM: Magnesium: 1.7 mg/dL — ABNORMAL LOW

## 2012-11-29 LAB — URINALYSIS, COMPLETE
Glucose,UR: NEGATIVE mg/dL (ref 0–75)
Nitrite: NEGATIVE
Protein: 100
RBC,UR: 264 /HPF (ref 0–5)
WBC UR: 7724 /HPF (ref 0–5)

## 2012-11-30 LAB — CBC WITH DIFFERENTIAL/PLATELET
Bands: 1 %
Lymphocytes: 8 %
MCH: 29.8 pg (ref 26.0–34.0)
MCHC: 32.3 g/dL (ref 32.0–36.0)
MCV: 92 fL (ref 80–100)
Monocytes: 19 %
Platelet: 198 10*3/uL (ref 150–440)
RBC: 3.39 10*6/uL — ABNORMAL LOW (ref 3.80–5.20)
RDW: 19.5 % — ABNORMAL HIGH (ref 11.5–14.5)
Segmented Neutrophils: 71 %
WBC: 12.2 10*3/uL — ABNORMAL HIGH (ref 3.6–11.0)

## 2012-11-30 LAB — PHENYTOIN LEVEL, TOTAL: Dilantin: 13.2 ug/mL (ref 10.0–20.0)

## 2012-11-30 LAB — BASIC METABOLIC PANEL
BUN: 13 mg/dL (ref 7–18)
Calcium, Total: 7.9 mg/dL — ABNORMAL LOW (ref 8.5–10.1)
Chloride: 96 mmol/L — ABNORMAL LOW (ref 98–107)
Co2: 25 mmol/L (ref 21–32)
Glucose: 127 mg/dL — ABNORMAL HIGH (ref 65–99)
Osmolality: 268 (ref 275–301)
Sodium: 133 mmol/L — ABNORMAL LOW (ref 136–145)

## 2012-12-02 LAB — URINE CULTURE

## 2012-12-20 ENCOUNTER — Ambulatory Visit: Payer: Self-pay | Admitting: Oncology

## 2012-12-20 DEATH — deceased

## 2014-06-08 IMAGING — CT CT ABDOMEN W/ CM
1 of 3 series · 12 of 32 positions shown, 18 images · non-contrast
Comparison: none

REASON FOR EXAM: pancreatic CA restage
COMMENTS:

[Series 2: abd 3mm w 3.0 i40f 3 · axial · 0.75mm/px · z∈[-885,-675]mm · 12 of 82 slices shown, 18 images]
[im 6/82  soft-tissue]
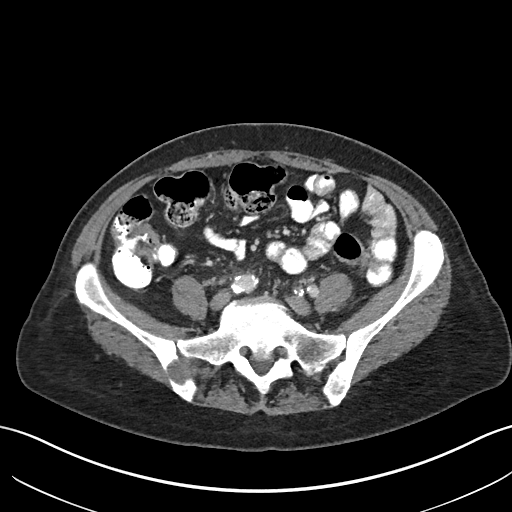
[im 6/82  bone]
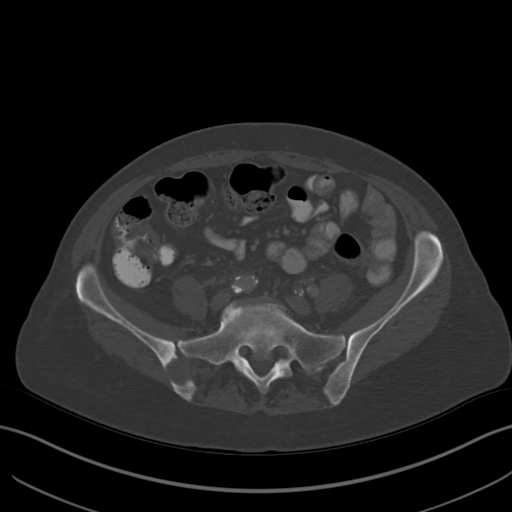
[im 12/82  soft-tissue]
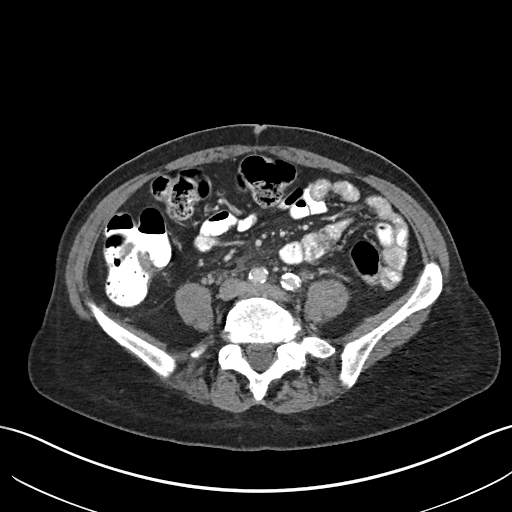
[im 18/82  soft-tissue]
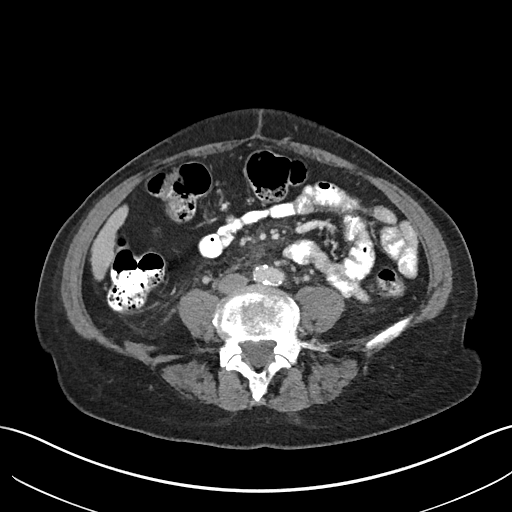
[im 24/82  soft-tissue]
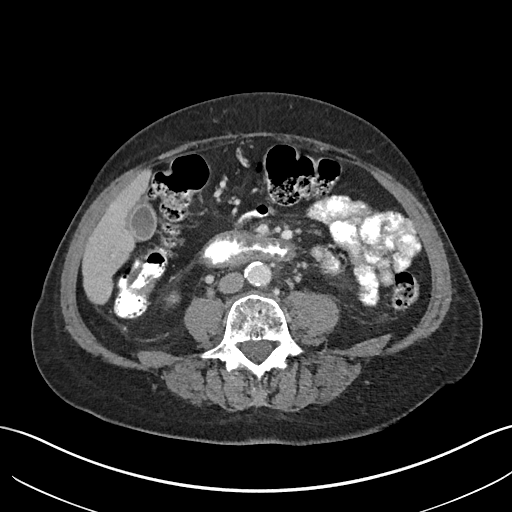
[im 29/82  soft-tissue]
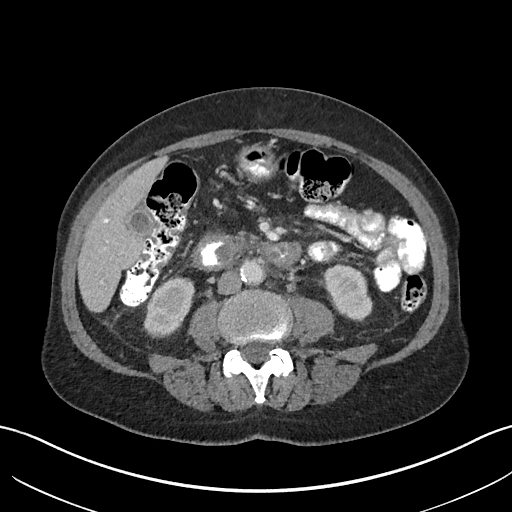
[im 35/82  soft-tissue]
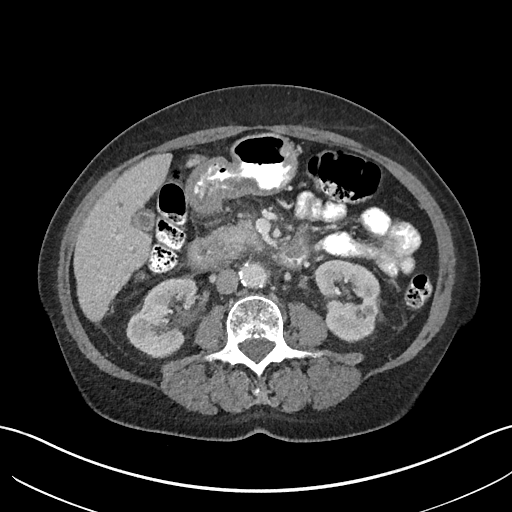
[im 47/82  soft-tissue]
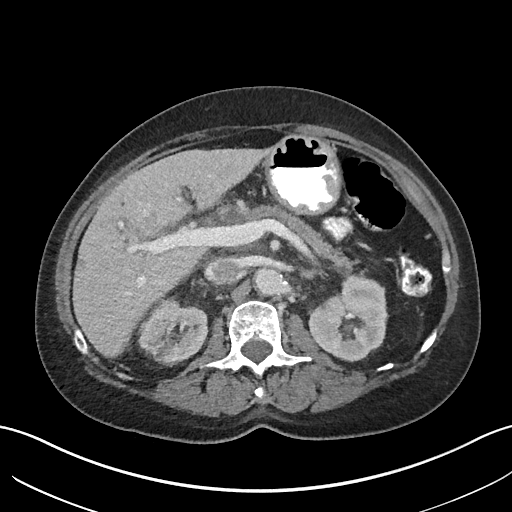
[im 53/82  soft-tissue]
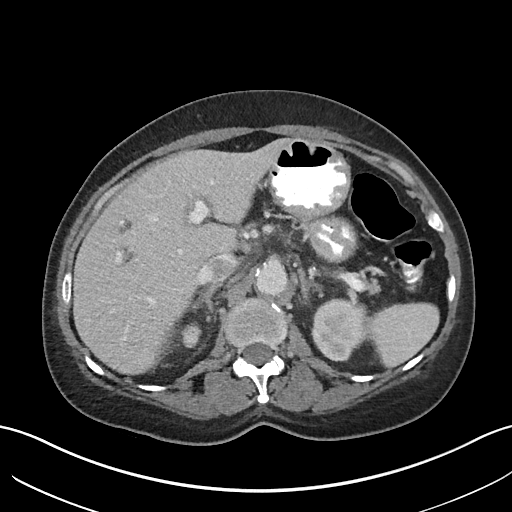
[im 58/82  soft-tissue]
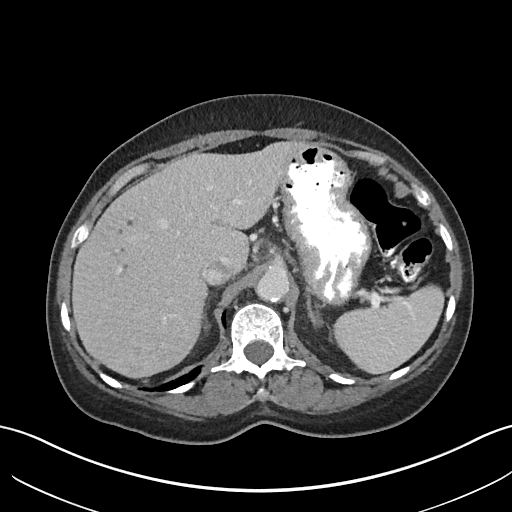
[im 58/82  lung]
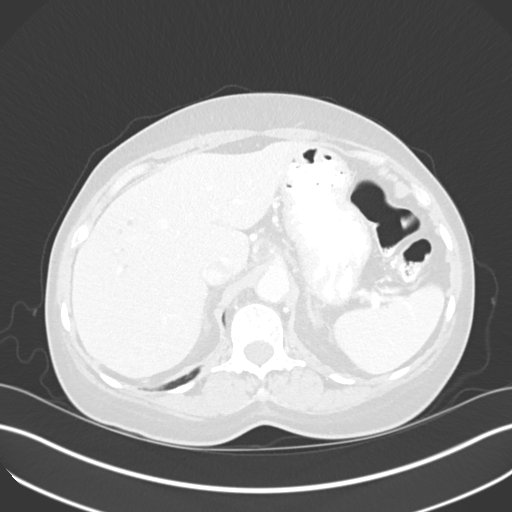
[im 58/82  bone]
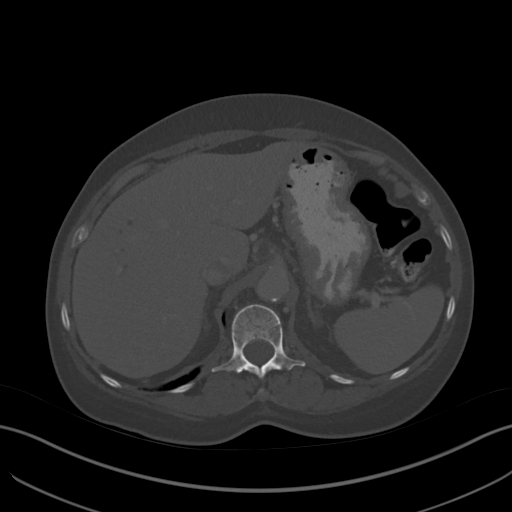
[im 64/82  soft-tissue]
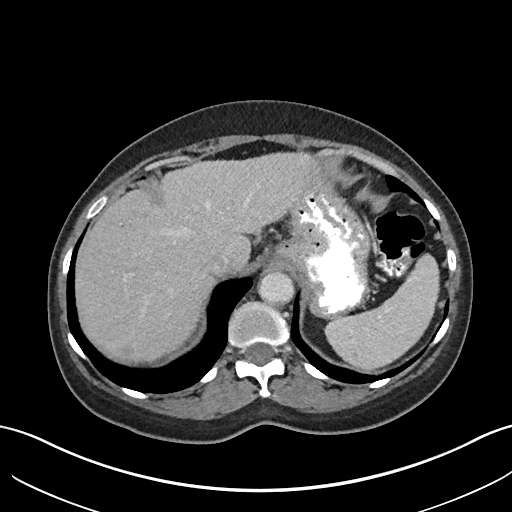
[im 64/82  lung]
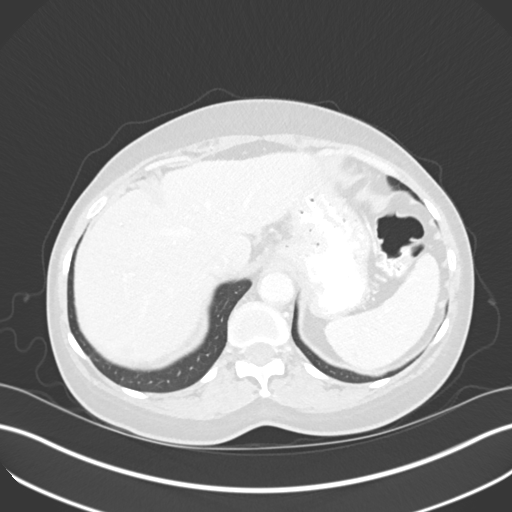
[im 70/82  soft-tissue]
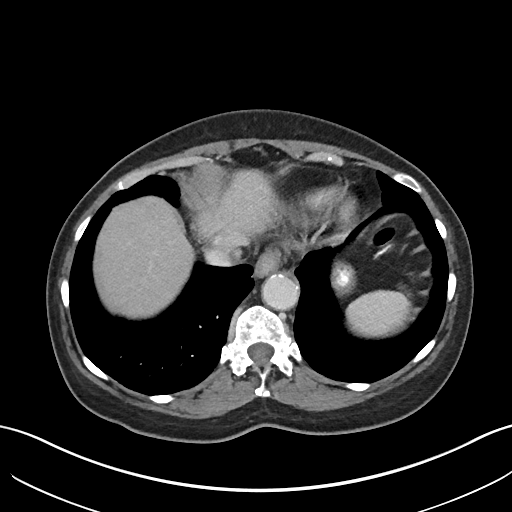
[im 70/82  lung]
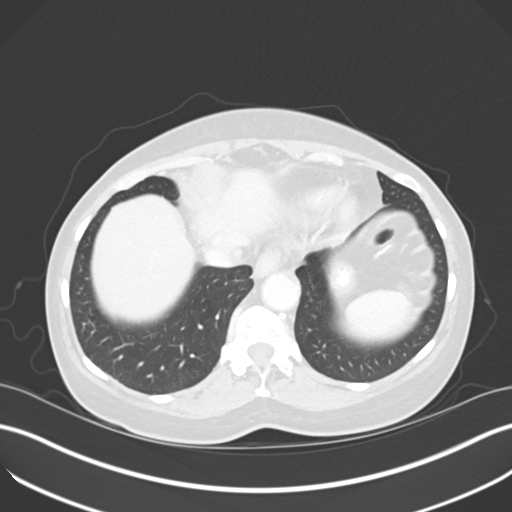
[im 76/82  soft-tissue]
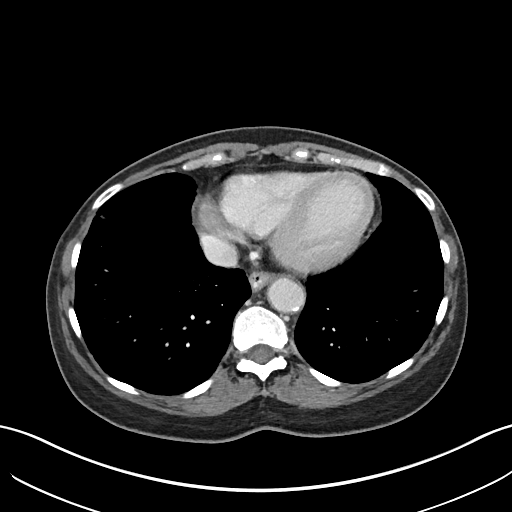
[im 76/82  lung]
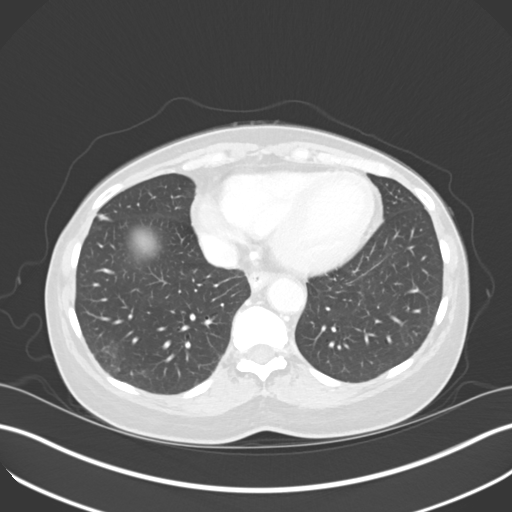

[12 of 32 positions shown; findings below may reference images not displayed]

PROCEDURE:     KCT - KCT ABDOMEN STANDARD W  - May 27, 2012  [DATE]

RESULT:     Axial CT scanning was performed through the abdomen with
reconstructions at 3 mm intervals and slice thicknesses. The patient
received 80 cc of Zsovue-SPP and also received oral contrast material.
Comparison made to the CT study December 21, 2011. Review of multiplanar
reconstructed images was performed separately on the VIA monitor.

The pancreatic head mass is less well-defined today. There does remain
abnormal low density in the posterior aspect of the pancreatic head. As best
as can be determined the pancreatic head mass now measures approximately
cm transversely x 2.4 cm AP. The pancreatic body and tail remain atrophic.
Mass effect upon the adjacent duodenum is present and better demonstrated
due to the presence of contrast within the duodenum. There is thickening of
the wall of the duodenum diffusely.

There is mild intrahepatic ductal dilation.  There remain parenchymal masses
within the liver. Several are less conspicuous than on the previous study.
However, one in the posterior aspect of the right lobe measures
approximately 2.5 centimeters in greatest dimension. It previously measured
1.5 cm in greatest dimension. The gallbladder is only partially distended
and exhibits no evidence of stones. A small amount of pericholecystic fluid
remains. The stomach is partially distended with contrast and exhibits
thickening of the wall of the antrum. The spleen is not enlarged. There are
no adrenal masses. The kidneys exhibit no acute abnormality.

The caliber of the abdominal aorta is normal. I see no bulky intraperitoneal
nor retroperitoneal lymphadenopathy. No significant ascites is demonstrated.
There is a lytic lesion in the medial aspect of the right iliac bone which
has increased in size since the previous study. The lumbar vertebral bodies
are preserved in height.
IMPRESSION: 1. The pancreatic mass itself is not as well defined today and is not
markedly increased in size. However, further mass effect upon the adjacent
duodenum is present and there is diffuse thickening of the wall of the
duodenum and a portions of the gastric antrum.
2. There are persistent metastatic foci within the liver one in the right
lobe appearing to have increased in size.
3. There has been interval increase in the size of the lytic lesion in the
right iliac bone. This is demonstrated on the lowermost images of the CT
scan of the abdomen only.
4. There is no evidence of ascites. No bulky intraperitoneal or
retroperitoneal lymph nodes are demonstrated.

Overall there does appear to have been disease progression.

[REDACTED]

## 2014-10-22 IMAGING — CR DG CHEST 1V PORT
1 series · 1 of 1 positions shown · non-contrast
Comparison: none

REASON FOR EXAM: sob
COMMENTS:

PROCEDURE:     DXR - DXR PORTABLE CHEST SINGLE VIEW  - October 10, 2012  [DATE]
RESULT:     Port-A-Cath and dual lumen catheter in good position.
Atelectasis versus pneumonia right lung base. Cardiomegaly. Pulmonary
vascularity is normal.

[ap]
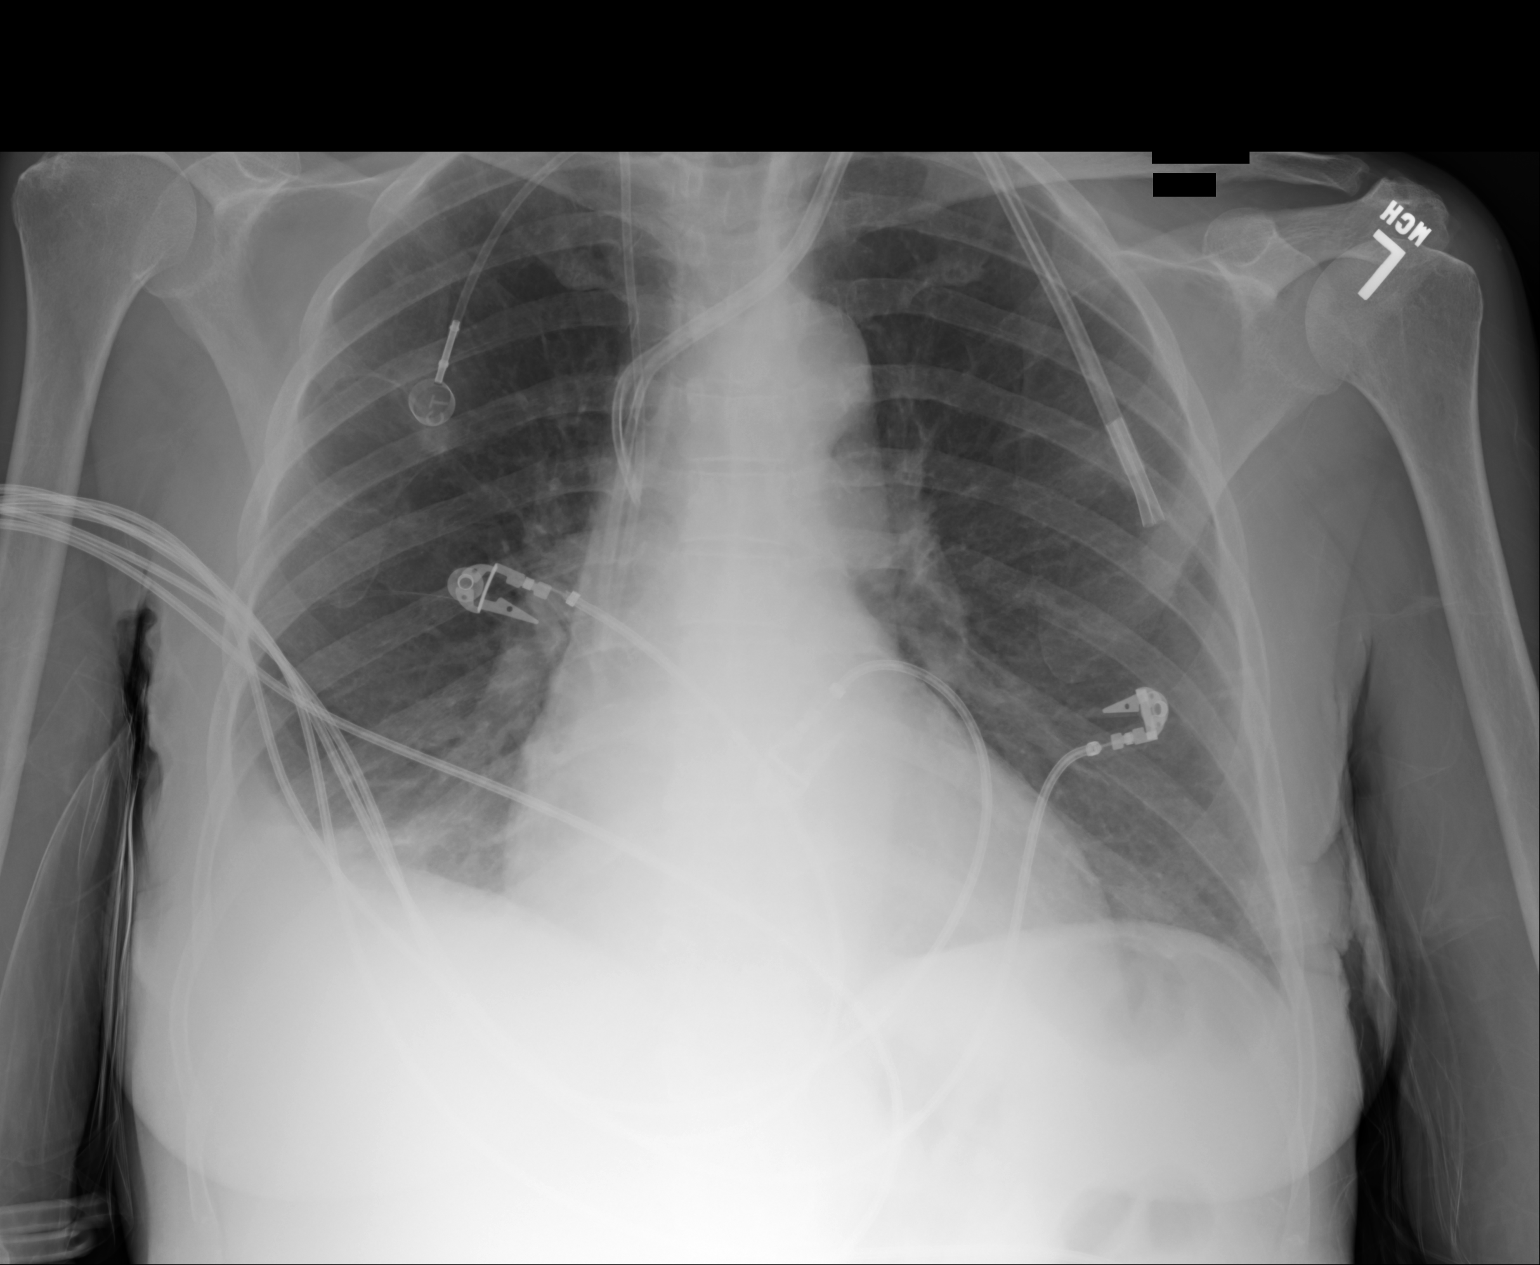

[1 of 1 positions shown; findings below may reference images not displayed]

IMPRESSION: Atelectasis versus infiltrate right lung base. Right
pleural effusion. Similar finding on 09/19/2012.

## 2015-01-08 NOTE — Op Note (Signed)
PATIENT NAME:  Carmen Gibson, Carmen Gibson MR#:  [id number] DATE OF BIRTH:  [birth year]  DATE OF PROCEDURE:  07/19/2012  PREOPERATIVE DIAGNOSIS:  1. Acute on chronic renal insufficiency.  2. Pancreatic cancer.   POSTOPERATIVE DIAGNOSIS:  1. Acute on chronic renal insufficiency.  2. Pancreatic cancer.   PROCEDURE PERFORMED: Insertion of right femoral triple lumen dialysis catheter, temporary type.   PROCEDURE PERFORMED BY: Renford DillsGregory G. Eri Platten, MD   DESCRIPTION OF PROCEDURE: The patient is in her hospital room. She is positioned supine and the right groin is prepped and draped in a sterile fashion. Ultrasound is placed in a sterile sleeve. Ultrasound is utilized secondary to lack of appropriate landmarks and to avoid vascular injury. Under direct ultrasound visualization, the common femoral vein is identified. It is echolucent, homogeneous, and easily compressible indicating patency. Under direct ultrasound visualization, a microneedle is inserted into the anterior wall of the common femoral vein, microwire followed by a micro sheath, a J-wire followed by a dilator. The catheter is then inserted without difficulty. All three lumens aspirate and flush easily. The catheter is secured to the skin of the thigh with 2-0 nylon and a sterile dressing is applied. The patient tolerated the procedure well, and there were no immediate complications.   ____________________________ Renford DillsGregory G. Kahleah Crass, MD ggs:cbb D: 07/22/2012 15:48:06 ET T: 07/22/2012 18:12:53 ET JOB#: [id number]  cc: Renford DillsGregory G. Chassidy Layson, MD, <Dictator> Munsoor Lizabeth LeydenN. Lateef, MD Renford DillsGREGORY G Betta Balla MD ELECTRONICALLY SIGNED 08/02/2012 12:13

## 2015-01-08 NOTE — Consult Note (Signed)
History of Present Illness:   Reason for Consult Stage IV pancreatic cancer, worsening edema, shortness of breath.    HPI   Patient is a 64 year old female with a long-standing history of stage IV pancreatic cancer who has not received chemotherapy since June 01, 2012.  She was recently discharged from the hospital several days ago with the diagnoses of acute renal failure requiring temporary dialysis.  Over the past 24 hours, she noted increasing lower extremity swelling, increased abdominal bloating, as well as increasing shortness of breath.  Upon arrival to the emergency room patient was noted to have malignant hypertension and had seizure x2. Currently, she continues to feel short of breath but otherwise feels well.  She has no further neurologic complaints.  She denies any pain.  She denies any.  She continues to feel abdominal bloating, but denies any nausea or vomiting.  She also has had decreased urine output over the past 24 hours.  Patient offers no further specific complaints.  PFSH:   Additional Past Medical and Surgical History Past medical history:  Stage IV pancreatic cancer with liver metastasis, hypertension, hyperlipidemia, COPD, allergic rhinitis.  Past surgical history: C-section, lymph node dissection, port placement.  Family history: 2 brothers with pancreatic cancer.  Social history: Positive tobacco approximately 30 pack years, denies alcohol.   Review of Systems:   Performance Status (ECOG) 2    Review of Systems   As per HPI. Otherwise, 10 point system review was negative.   NURSING NOTES: **Vital Signs.:   27-Oct-13 08:10    Vital Signs Type: Admission    Temperature Temperature (F): 98.3    Celsius: 36.8    Pulse Pulse: 90    Respirations Respirations: 19    Systolic BP Systolic BP: 333    Diastolic BP (mmHg) Diastolic BP (mmHg): 832    Mean BP: 131    Pulse Ox % Pulse Ox %: 100    Pulse Ox Activity Level: At rest    Oxygen Delivery:  2L   Physical Exam:   Physical Exam General: Ill-appearing, no acute distress. Eyes: Pink conjunctiva, anicteric sclera. HEENT: Normocephalic, moist mucous membranes, clear oropharnyx. Lungs: Clear to auscultation bilaterally. Heart: Regular rate and rhythm. No rubs, murmurs, or gallops. Abdomen: Soft, nontender, mildly distended. normoactive bowel sounds. Musculoskeletal: SCDs in place. Neuro: Alert, answering all questions appropriately. Cranial nerves grossly intact. Skin: No rashes or petechiae noted. Psych: Normal affect.    No Known Allergies:     predniSONE 50 mg oral tablet: 1 tab(s) orally once a day, Active, 7, None   pantoprazole 40 mg oral delayed release tablet: 1 tab(s) orally 2 times a day, Active, 0, None   amlodipine 5 mg oral tablet: 1 tab(s) orally once a day, Active, 0, None   Claritin 10 mg oral tablet: 1 tab(s) orally once a day, Active, 0, None   Norco 5 mg-325 mg oral tablet: 1 tab(s) orally every 6 hours, As Needed- for Pain , Active, 0, None   promethazine 25 mg oral tablet: 1 tab(s) orally every 4 hours, As Needed- for Nausea, Vomiting , Active, 60, 1   predniSONE: 50 milligram(s) orally once a day for 7 days, Active, 0, None  Laboratory Results: Hepatic:  27-Oct-13 01:06    Bilirubin, Total  3.3   Alkaline Phosphatase  354   SGPT (ALT)  120   SGOT (AST)  189   Total Protein, Serum 7.4   Albumin, Serum  3.0  Routine Chem:  27-Oct-13 01:06  B-Type Natriuretic Peptide [facility])  [phone] (Result(s) reported on 17 Jul 2012 at 04:13AM.)   Result Comment PLT - VERIFIED BY SMEAR ESTIMATE  Result(s) reported on 17 Jul 2012 at 02:23AM.   Glucose, Serum  131   BUN  33   Creatinine (comp)  4.36   Sodium, Serum  127   Potassium, Serum 3.6   Chloride, Serum  90   CO2, Serum 25   Calcium (Total), Serum 8.5   Osmolality (calc) 264   eGFR (African American)  12   eGFR (Non-African American)  10 (eGFR values <49mL/min/1.73 m2 may be an indication of  chronic kidney disease (CKD). Calculated eGFR is useful in patients with stable renal function. The eGFR calculation will not be reliable in acutely ill patients when serum creatinine is changing rapidly. It is not useful in  patients on dialysis. The eGFR calculation may not be applicable to patients at the low and high extremes of body sizes, pregnant women, and vegetarians.)   Anion Gap 12  Routine Hem:  27-Oct-13 01:06    WBC (CBC)  16.4   RBC (CBC) 3.97   Hemoglobin (CBC)  11.4   Hematocrit (CBC)  32.2   Platelet Count (CBC)  69   MCV 81   MCH 28.8   MCHC 35.5   RDW  22.5   Assessment and Plan:  Impression:   Stage IV pancreatic cancer, worsening edema, shortness of breath.  Plan:   1.  Pancreatic cancer: Patient's most recent chemotherapy was on June 01, 2012.  Given her declining performance status as well as worsening renal function, no further treatments are planned at this time.  Will get a CA 19-9 for completeness.   Acute renal failure: Case discussed with nephrology, no acute indication for dialysis at this time. Anemia: Patient's hemoglobin is likely falsely elevated due to hemoconcentration. Patient received 2 units packed red blood cells several days ago. Continue to monitor.   Thrombocytopenia: Decreased, but stable.  Continue prednisone as ordered.   Electronic Signatures: Delight Hoh (MD)  (Signed 27-Oct-13 11:15)  Authored: HISTORY OF PRESENT ILLNESS, PFSH, ROS, NURSING NOTES, PE, ALLERGIES, HOME MEDICATIONS, LABS, ASSESSMENT AND PLAN   Last Updated: 27-Oct-13 11:15 by Delight Hoh (MD)

## 2015-01-08 NOTE — Discharge Summary (Signed)
PATIENT NAME:  Carmen Gibson, Airyanna V MR#:  [id number] DATE OF BIRTH:  [birth year]  DATE OF ADMISSION:  07/06/2012 DATE OF DISCHARGE:  07/15/2012  FINAL DIAGNOSES:  1. Microangiopathic anemia and renal failure, which is acute renal failure, most likely secondary to metastatic pancreatic cancer.  2. Carcinoma of pancreas metastatic to liver, progressing disease.  3. Hypertension.  4. Patient underwent hemodialysis and multiple blood transfusions.  5. Significant findings of anemia, thrombocytopenia, and acute renal failure.   DISCHARGE MEDICATIONS:  1. Prevacid 40 mg p.o. daily. 2. Norvasc 5 mg p.o. daily.  3. Claritin 10 mg p.o. daily.  4. Vicodin 5/325, 1 tablet q. 4-6 h. p.r.n. for pain.  5. Phenergan 25 mg p.o. q. 4-6 h. p.r.n. for nausea.  6. Prednisone 60 mg daily.   DIET: Regular.   ACTIVITY:  No limitations.   FOLLOWUP: Appointment to see me on Monday, two days after discharge, for further followup.   HISTORY OF PRESENT ILLNESS: Carmen Gibson is a patient known to me, 64 year old African American lady who has a history of carcinoma of the pancreas metastatic to the liver. The patient had received her last dose of Abraxane and gemcitabine in September 2013. Chemo was put on hold because of progressing side effect of neuropathy. The patient had presented to our office  feeling weak and tired with anemia and renal failure. The patient was admitted to the hospital for further evaluation and treatment consideration. For detailed past history, social and family history please refer to the dictated note on the chart.   AVAILABLE IMPORTANT LAB DATA: Uric acid was 8.3. Urinalysis was showing WBCs 9 per high-power field. 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 Urine culture was negative. White count 10.7, hemoglobin 6.4. Platelet count was 58,000. CT scan of the abdomen without contrast showed progressing disease.   HOSPITAL COURSE: During the hospital stay, the patient  was found to have hemolytic anemia.  Peripheral smear revealed possibility of microangiopathic process. It was suspected that the patient had microangiopathic anemia and renal failure secondary to either metastatic carcinoma of the pancreas or chemotherapy. The patient had multiple blood transfusions. The patient was started on IV steroid and later on switched to p.o. steroid. Nephrology was consulted and hemodialysis was done. The patient started feeling better. [facility], but according to them was not indicated and transfer was not done. As the patient was getting stabilized, creatinine improved. She was discharged with advice to get followup. Overall prognosis is guarded because of underlying condition.      ____________________________ Gerome SamJanak K. Carmencita Cusic, MD jkc:bjt D: 07/26/2012 08:15:45 ET T: 07/26/2012 11:17:53 ET JOB#: [id number]  cc: Valarie ConesJanak K. Doylene Canninghoksi, MD, <Dictator> Laddie AquasJANAK K Serina Nichter MD ELECTRONICALLY SIGNED 07/27/2012 8:37

## 2015-01-08 NOTE — H&P (Signed)
PATIENT NAME:  Carmen Gibson, Adine V MR#:  [id number] DATE OF BIRTH:  [birth year]  DATE OF ADMISSION:  07/17/2012  PRIMARY CARE PHYSICIAN: [facility] Clydie Braunavid [facility]  NEPHROLOGIST: Dr. Cherylann RatelLateef   CHIEF COMPLAINT: Lower extremity edema, facial edema and bilateral upper extremity edema associated with minimal shortness of breath.   HISTORY OF PRESENT ILLNESS: Carmen Gibson is a very pleasant 64 year old African American female with a past medical history of metastatic pancreatic cancer treated with chemotherapy and radiation, chemotherapy is on hold for the past six weeks in view of acute kidney injury, hypertension which is recently uncontrolled with proteinuria, hyperlipidemia, seasonal allergies and recent anemia, thrombocytopenia was admitted to the hospital in October and was discharged on 10/25 with acute kidney injury is presenting to the ER with chief complaint of developing bilateral lower extremity edema, facial edema and bilateral upper extremity edema. Patient was just discharged from the hospital day before yesterday, 07/15/2012, after having two episodes of temporary dialysis for acute kidney injury which was assumed to be from recent chemotherapy for metastatic pancreatic cancer. The chemotherapy was held for the past six weeks. Patient was doing fine until today morning and then she first started noticing swelling in her lower extremities. Patient has called Dr. Orlie DakinFinnegan, the oncologist, who has recommended to keep her legs elevated. Eventually patient started noticing swelling her bilateral upper extremities and in the face. She called Dr. Orlie DakinFinnegan again who has recommended the patient to come to the ER. Patient is denying any chest pain but complaining of very minimal shortness of breath. She has received Lasix 40 mg IV in the ER. Patient is reporting that recently she is not making much urine and yesterday and today she had a few drops of urine. Patient's initial blood  pressure was 230/115. Nitro paste one inch was attached to the anterior chest wall following which her blood pressure dropped down to 198/95. During my examination patient is complaining of headache in the back of her head but denies any blurry vision or chest pain. Denies nausea, vomiting, abdominal pain or fevers. Son is at bedside. No other complaints.   PAST MEDICAL HISTORY: 1. Metastatic pancreatic cancer treated with gemcitabine, Taxol as well as radiation therapy. Currently chemotherapy is on hold for the past six weeks. 2. Hypertension, recently difficult to control. 3. Hyperlipidemia. 4. History of chronic obstructive pulmonary disease.  5. History of lymph node dissection. 6. Allergic rhinitis.   PAST SURGICAL HISTORY:  1. C-section.  2. Port-A-Cath placement.  3. Lymph node dissection.   ALLERGIES: No known drug allergies.   HOME MEDICATION LIST: 1. Prednisone 50 mg 1 tablet once a day. 2. Protonix 1 tablet p.o. b.i.d.  3. Norco 5/325, 1 tablet q.6 hours. 4. Claritin 10 mg once daily. 5. Amlodipine 5 mg once daily.   PSYCHOSOCIAL HISTORY: [place] Lives with two daughters. She smokes two cigarettes per day and has 30 pack year history of smoking. Denies any alcohol or illicit drug usage.   FAMILY HISTORY: One brother passed away with pancreatic cancer. Another brother has pancreatic cancer.   REVIEW OF SYSTEMS: CONSTITUTIONAL: Patient denies fever, fatigue, weakness, pain, weight loss or weight gain. EYES: Denies blurry vision. No redness of the eyes. No inflammation, glaucoma or cataracts. ENT: Denies tinnitus, ear pain, hearing loss. Denies epistaxis, discharge, snoring, postnasal drip. RESPIRATORY: Denies cough, wheezing, hemoptysis. Complaining of shortness of breath very minimal. Denies any history of asthma. Positive chronic obstructive pulmonary disease. CARDIOVASCULAR: Denies chest pain,  orthopnea. Denies edema. Denies erythema. Denies palpitations, syncope.  GASTROINTESTINAL: Denies nausea, vomiting, diarrhea. Denies abdominal pain, hematemesis, melena, gastroesophageal reflux disease. GENITOURINARY: Denies dysuria, hematuria, frequency or incontinence. Complaining of decreased urination. ENDOCRINE: Denies polyuria, nocturia, increased sweating. HEME: Denies any easy bruising or bleeding. MUSCULOSKELETAL: Complaining of pain in the back of the head but denies neck pain, shoulder pain. Denies knee pain or hip pain. NEUROLOGICAL: Denies any dysarthria, epilepsy, tremors, vertigo. PSYCH: Denies anxiety, insomnia, bipolar disorder.   PHYSICAL EXAMINATION: VITAL SIGNS: Initial blood pressure 222/113 with pulse 78, respiratory rate 18, temperature 98.2, pulse ox 98% on room air. After attaching nitro paste blood pressure dropped down to 198/95.  GENERAL APPEARANCE: Well built and well nourished, looks appropriate for her age, not in acute distress.  HEENT: Normocephalic, atraumatic. Pupils are equally reacting to light and accommodation. Extraocular movements are intact. No conjunctival injection. No epistaxis. Hearing is grossly intact. Moist mucous membranes.   NECK: Supple. No jugular venous distention. No masses.   LUNGS: Moderate air entry. Right lower lobe positive rhonchi, decreased air entry.   CARDIOVASCULAR: S1, S2 normal. Regular rate and rhythm. Point of maximal impulse is not lateralized. No anterior chest wall tenderness.   GASTROINTESTINAL: Soft. Bowel sounds are positive in all four quadrants. Nontender, nondistended.   NEUROLOGICAL: Awake, alert, oriented x3. Answering all questions appropriately. Cranial nerves III through XII grossly intact. Reflexes are 2+. Tone normal.   EXTREMITIES: 1+ pitting edema is present. Positive facial puffiness. Bilateral upper extremities trace edema.   LABORATORY, DIAGNOSTIC AND RADIOLOGICAL DATA: Chest x-ray has revealed right lower lobe pleural effusion. EKG normal sinus rhythm, no acute ST-T wave  changes. BNP 34790, glucose 131, sodium 127, potassium 3.6, chloride 90, CO2 25, BUN 33, creatinine 4.36. At the time of discharge on 10/25 creatinine was at 3.80 with BUN of 36. GFR 12. CO2 25, total protein 7.4, albumin 3.0, total bilirubin is elevated at 3.3, alkaline phosphatase is elevated at 354, SGOT 189, SGPT 120, WBC 16,400, hemoglobin 11.4, hematocrit 32.2, platelet count 69,000.   ASSESSMENT AND PLAN:  1. Malignant hypertension. Nitro paste is attached to the anterior chest wall. Will start the patient on hydralazine 25 mg p.o. q.8 hours and Procardia 60 mg p.o. once daily, first doses were given in the ER. Will titrate medications as needed basis.  2. Progressing anasarca with oliguria. Patient has received Lasix 40 mg IV x1 in the ER with very minimal urination. Will put a nephrology consult to Dr. Garnett Farm group for possible hemodialysis.  3. Acute kidney injury on chronic kidney disease, assumed to be from recent chemotherapy. Chemotherapy is on hold for the past six weeks. Avoid nephrotoxins. Monitor renal function closely. Follow up with nephrology.  4. Thrombocytopenia probably from hepatorenal syndrome/metastatic pancreatic cancer with recent chemotherapy. Patient was taking Norco at home. Will discontinue Tylenol. Give her morphine as needed for pain.  5. Chronic history of chronic obstructive pulmonary disease, stable now. Will give her bronchodilators as needed basis.  6. Allergic rhinitis. Continue her home medication, Claritin.  7. GI prophylaxis will be continued with Protonix. 8. DVT prophylaxis. Will provide her SCDs. I cannot start the patient on heparin or Lovenox in view of thrombocytopenia.  9. Consult is placed with nephrology, Dr. Cherylann Ratel and oncology, Dr. Lorre Nick. Patient will be turned over to [facility], Dr. Jarrett Ables group in a.m. at 7:00. Patient was signed off to [facility] group in a.m.  10. She is FULL CODE.   The diagnosis  and plan of care  was discussed in detail with the patient. She verbalized understanding of the plan.   TOTAL TIME SPENT ON ADMISSION: 60 minutes.   ____________________________ Ramonita Lab, MD ag:cms D: 07/17/2012 06:30:28 ET T: 07/17/2012 10:10:53 ET JOB#: [id number] cc: Ramonita Lab, MD, <Dictator> Stann Mainland. Sampson Goon, MD Munsoor Lizabeth Leyden, MD Knute Neu. Lorre Nick, MD Ramonita Lab MD ELECTRONICALLY SIGNED 07/30/2012 23:58

## 2015-01-08 NOTE — Consult Note (Signed)
PATIENT NAME:  Carmen Gibson, Carmen Gibson MR#:  [id number] DATE OF BIRTH:  [birth year]  DATE OF CONSULTATION:  07/08/2012  REFERRING PHYSICIAN:  Dr. Barbette Reichmann  CONSULTING PHYSICIAN:  Daishaun Ayre Lilian Kapur, MD  REASON FOR CONSULTATION: Acute renal failure, hypertension, proteinuria.   HISTORY OF PRESENT ILLNESS: The patient is a very pleasant 64 year old African American female with past medical history of hypertension, hyperlipidemia, metastatic pancreatic cancer treated with chemotherapy and radiation, seasonal allergies who was referred over to [facility] for worsening renal insufficiency. The patient's baseline creatinine is 1.25 from 05/11/2012. At that point in time she had an eGFR of 54. From medical records and the patient's report it appears that she last received chemotherapy with gemcitabine and Taxol on 06/01/2012. At that time creatinine was 1.46 with an eGFR of 45. However, here recently the patient has had elevated creatinine. On 07/05/2012 creatinine was 2.87. Today creatinine is up to 3.03. Patient has also developed worsening anemia with a hemoglobin of 6.4 on 07/07/2012. She was transfused and hemoglobin is currently up to 8.2. However, she has a very elevated LDH at 798. She also has low platelets at 55,000. The patient also has a very high urine protein level of greater than 500 mg/dL. She also has mild bit of hematuria with 7 RBCs per high-power field. Serum protein electrophoresis and urine protein electrophoresis are negative. Patient also has a normal free kappa to lambda light chain ratio. The patient had a CT scan which showed abnormal mass in the pancreatic head. There were hypodensities in the liver compatible with metastatic disease. There was no sign of obstruction. This CT scan was performed without contrast. I have discussed the case in depth with Dr. Inez Pilgrim and it appears that hemolytic uremic syndrome from gemcitabine administration is a possibility. The patient  has also had more difficult to control blood pressure recently.   PAST MEDICAL HISTORY:  1. Metastatic pancreatic cancer treated with gemcitabine, Taxol as well as radiation therapy.  2. Hypertension.  3. Hyperlipidemia.  4. History of chronic obstructive pulmonary disease.  5. History of lymph node dissection.  6. Seasonal allergies.   ALLERGIES: No known drug allergies.    CURRENT INPATIENT MEDICATIONS:  1. Norco 5/325, 1 tablet p.o. every six hours p.r.n.  2. Amlodipine 5 mg daily.  3. Multivitamin 1 capsule p.o. daily.  4. Protonix 40 mg p.o. twice a day. 5. Phenergan 25 mg p.o. every six hours p.r.n.  6. Hydralazine 25 mg p.o. q.8 hours.   SOCIAL HISTORY: Patient lives in [place]. She does have history of tobacco abuse, approximately 30 pack-years. No alcohol or illicit drug use.   FAMILY HISTORY: Significant for diabetes mellitus and hypertension.    REVIEW OF SYSTEMS: CONSTITUTIONAL: Currently denies fevers, chills. Has had some weight loss over time. EYES: Denies diplopia, blurry vision. HEENT: Denies headaches, hearing loss. Denies epistaxis. CARDIOVASCULAR: Currently denies chest pain, palpitations, PND, orthopnea. RESPIRATORY: Denies cough or hemoptysis. Does have history of chronic obstructive pulmonary disease. GASTROINTESTINAL: Has had an episode of nausea and vomiting last week but none this week. GENITOURINARY: Denies frequency, urgency, or dysuria. MUSCULOSKELETAL: Denies joint pain, swelling, or redness. INTEGUMENTARY: Denies skin rashes or lesions. NEUROLOGIC: Denies focal extremity numbness, weakness, or tingling at present. 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 PSYCHIATRIC: Denies depression, bipolar disorder. ENDOCRINE: Denies polyuria, polydipsia, polyphagia. HEMATOLOGIC/LYMPHATIC: Has had decreasing hemoglobin as well as low platelets are recently. Also has  history of pancreatic cancer that is  metastatic. ALLERGY/IMMUNOLOGIC: Has history of seasonal allergies.   PHYSICAL EXAMINATION:  VITAL SIGNS: Temperature 98.5, pulse 90, respirations 20, blood pressure 182/104, pulse oximetry 99%.   GENERAL: Well developed, well nourished [place] American female who appears her stated age, currently no acute distress.   HEENT: Normocephalic, atraumatic. Extraocular movements are intact. Pupils equal, round, reactive to light. No scleral icterus. Conjunctivae are pink. No epistaxis noted. Gross hearing intact. Oral mucosa moist. Dentures are noted.   NECK: Supple and without JVD or lymphadenopathy.   LUNGS: Clear to auscultation bilaterally with normal respiratory effort.   CARDIOVASCULAR: S1, S2 regular rate and rhythm. No murmurs or rubs appreciated.   ABDOMEN: Soft, nontender, nondistended. Bowel sounds present. No rebound or guarding.   EXTREMITIES: No clubbing, cyanosis, edema.   NEUROLOGIC: Patient is alert, oriented to time, person, and place. Strength is 5/5 in both upper and lower extremities.   SKIN: Warm and dry. No rashes noted.   MUSCULOSKELETAL: No joint redness, swelling or tenderness appreciated.   GENITOURINARY: No suprapubic tenderness noted at this time.   PSYCHIATRIC: Patient with appropriate affect and appears to have good insight into her current illness.   LABORATORY, DIAGNOSTIC AND RADIOLOGICAL DATA: Creatinine 3.03, potassium 4.2, EGFR 19. LDH quite elevated at 798. CBC shows WBC 11.7, hemoglobin 7.6, hematocrit 22, platelets 55. On 05/11/2012 creatinine is 1.25. On 06/01/2012 platelet count was 445. Urinalysis shows urine protein greater than 500 mg/dL, 7 RBCs per high-power field and 9 WBCs per high-power field. Serum protein electrophoresis is negative for M spike. Urine protein electrophoresis is negative for M spike. Free kappa to lambda light chain ratio is normal at 1.26.    IMPRESSION: This is a 64 year old African American female with past medical history  of metastatic pancreatic cancer, hypertension, hyperlipidemia, chronic obstructive pulmonary disease, history of lymph node dissection, seasonal allergies who presented to [facility] for evaluation of acute renal failure. Patient now found to have acute renal failure, proteinuria, anemia, thrombocytopenia in the setting of gemcitabine administration.   PROBLEM LIST:  1. Acute renal failure.  2. Proteinuria.  3. Anemia.  4. Thrombocytopenia.  5. Recent difficult to control hypertension.   PLAN: The patient presents with a very interesting case. She has had progressive anemia and thrombocytopenia after gemcitabine administration. She has also had worsening hypertension, acute renal failure, and proteinuria. After performing literature review it appears that there have been reported cases of gemcitabine nephrotoxicity leading to hemolytic uremic syndrome. It appears that the patient's presentation fits this pattern at present. She had negative serum protein electrophoresis and urine protein electrophoresis. She has had quite difficult to control blood pressure during the course of this hospitalization. We could potentially consider renal biopsy over time, however, we are hesitant to do this given anemia and thrombocytopenia at this time. We will further quantify the patient's proteinuria with urine protein to creatinine ratio. Normally this condition does resolve with holding gemcitabine. I discussed the case in depth with Dr. Inez Pilgrim. We noted that the LDH was quite high. He will be checking a peripheral smear specifically for schistocytes. We have given some consideration to plasmapheresis, however, this is not likely an antibody mediated process. Dr. Ma Hillock and Dr. Candiss Norse will confer tomorrow about what to do regarding this case, however, we will await further evaluation at this time. No acute indication for dialysis at this time, however, if renal function were to worsen we may need to  consider this.  I would like to thank Dr. Inez Pilgrim for this very kind referral and opportunity to take part in this very interesting case.  ____________________________ Tama High, MD mnl:cms D: 07/08/2012 12:56:03 ET T: 07/08/2012 13:12:24 ET JOB#: [id number]  cc: Tama High, MD, <Dictator> Mariah Milling Denham Mose MD ELECTRONICALLY SIGNED 08/02/2012 10:44

## 2015-01-08 NOTE — Discharge Summary (Signed)
PATIENT NAME:  Carmen Gibson, Carmen Gibson MR#:  [id number] DATE OF BIRTH:  October 16, 1950  DATE OF ADMISSION:  07/06/2012 DATE OF DISCHARGE:  07/10/2012   DIAGNOSES:  1. Known history of stage IV pancreatic cancer with liver metastasis status post chemotherapy with Abraxane/Gemzar, last dose on 06/01/2012. Recent rising CA-19-9. Repeat CT scan shows persistent liver lesion with possible progression.  2. Acute renal insufficiency. 3. Proteinuria. 4. Progressive anemia. 5. Thrombocytopenia. 6. Elevated LDH. 7. Schistocytes on smear. 8. Hypertension. 9. 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   HISTORY OF PRESENT ILLNESS: The patient is a 64 year old female with known history of stage IV pancreatic cancer who was admitted to the hospital on October 16th when her creatinine had jumped up to 2.87 from 1.84 on October 9th. The patient also had CT scan of the abdomen without contrast on October 15th which continued to show liver metastasis. Recent CA-19-9 also was rising and up to 155 (from 77 before). The patient had further work-up along with IV hydration and Nephrology was also consulted. She clinically, otherwise, seemed to be doing fairly steady except for fatigue on exertion. She denied any bleeding symptoms. No fevers. No altered mental status, seizures, falls, or loss of consciousness.   PAST MEDICAL HISTORY: 1. Stage IV pancreatic cancer.  2. Hypertension.  3. History of Cesarean section.   FAMILY HISTORY: Remarkable for hypertension, leukemia, diabetes. Denies other malignancy.   SOCIAL HISTORY: Chronic smoker, two cigarettes per day. Denies alcohol usage.   HOME MEDICATIONS:  1. Phenergan 25 mg p.o. q.4 hours p.r.n.  2. Pantoprazole 40 mg p.o. b.i.d.  3. Amlodipine 5 mg p.o. daily. 4. HCTZ 12.5 mg p.o. daily.  5. Extra-Strength Tylenol 500 mg p.r.n.  6. Multivitamin 1 daily.  7. Norco 5/325 mg 1 tablet q.6 hours  p.r.n.  8. Claritin 10 mg 1 tablet daily.   ALLERGIES: No known drug allergies.   REVIEW OF SYSTEMS AND PHYSICAL EXAMINATION: Please refer to history and physical note for details.   HOSPITAL COURSE: The patient was admitted to the Oncology floor. As stated above, creatinine had increased to 2.87 on October 15th. She was given IV hydration. Work-up was done including urinalysis which showed evidence of protein and 3+ blood. Urine culture showed mixed bacterial organisms. SIEP and UIEP negative for M spike. Free Kappa/Lambda ratio was unremarkable at 1.26. Serum LDH was elevated at 798. Direct Coombs test was negative. Peripheral smear reported significant schistocytes. Despite hydration and supportive treatment including packed red blood cell transfusion, the patient's renal function consistently continued to worsen and creatinine is up to 3.49 on October 19th and 3.66 on October 20th. She continues to have evidence of anemia with hemoglobin of 6.6 on October 20th with platelet count of 48, WBC normal at 9700 with ANC of 6900. Otherwise, she remains afebrile, no altered mental status. The patient was also seen by Nephrology. It was felt that patient most likely has microangiopathic hemolytic anemia/HUS syndrome possibly from gemcitabine versus other etiology. Given progressive renal insufficiency and that we do not have plasmapheresis available here, [facility] was contacted on October 19th and the case was discussed with Hematology fellow on call, Dr. Runell Gess, who stated that patient would need to go to Internal Medicine service, therefore, case was also discussed with Dr. Sherryle Lis, who is accepting physician once bed becomes available at [facility] for consideration of plasmapheresis. All these details had also been explained to patient including that she likely has progression  of her stage IV pancreatic cancer and overall prognosis is poor. The patient decided that she would still like to pursue aggressive  treatment for ongoing acute Hematology issues and renal insufficiency since if they showed improvement she wanted to continue on chemotherapy for pancreatic cancer. Given hemoglobin of 6.6 today on October 20th, the patient also is receiving 1 unit of packed red blood cells, otherwise, did not have bleeding symptoms. The [facility] when bed becomes available.   MEDICATIONS UPON DISCHARGE/TRANSFER:  1. Amlodipine 5 mg daily.  2. Hydralazine 25 mg p.o. q.8 hours.  3. Multivitamin 1 capsule daily.  4. Pantoprazole 40 mg p.o. b.i.d.  5. Tylenol 650 mg p.r.n. for pain or fever. 6. Norco 5/325 mg one q.6 hours p.r.n. for pain. 7. Phenergan 25 mg p.o. q.6 hours p.r.n. for nausea.  8. Temazepam 15 mg at bedtime p.r.n. for insomnia.   DIET: 2 gram sodium low fat diet.    ____________________________ Maren ReamerSandeep R. Sherrlyn HockPandit, MD srp:drc D: 07/10/2012 12:05:00 ET T: 07/10/2012 12:26:46 ET JOB#: [id number]  cc: Mina Carlisi R. Sherrlyn HockPandit, MD, <Dictator> Wille CelesteSANDEEP R Zev Blue MD ELECTRONICALLY SIGNED 07/11/2012 7:31

## 2015-01-08 NOTE — Consult Note (Signed)
Chief Complaint:   Subjective/Chief Complaint Patient was admitted in the hospital with microangiopathic anemia.  Renal failure.  Carcinoma of pancreas.   Hemoglobin has dropped to 6.9 patient is getting dialysis.   Holdup transfusion and recheck hemoglobin to bottle  Patient can proceed with permanent catheter placement avascular surgeon desires and platelet transfusion can be done even though I do not see any need for platelet transfusion.   Examination is stable.   Electronic Signatures: Laddie Aquashoksi, Mavi Un K (MD)  (Signed ([id number]) [id number]-Nov-[id number] 13:34)  Authored: Chief Complaint   Last Updated: 06-Nov-13 13:34 by Laddie Aquashoksi, Legend Tumminello K (MD)

## 2015-01-08 NOTE — Op Note (Signed)
PATIENT NAME:  Carmen Gibson, Carmen Gibson MR#:  [id number] DATE OF BIRTH:  [birth year]  DATE OF PROCEDURE:  07/28/2012  PREOPERATIVE DIAGNOSES:  1. End-stage renal disease.   2. Pancreatic cancer.   POSTOPERATIVE DIAGNOSES:  1. End-stage renal disease.   2. Pancreatic cancer.   PROCEDURES:  1. Ultrasound guidance for vascular access left jugular vein.  2. Fluoroscopic guidance for placement of catheter.  3. Placement of 23 cm tip to cuff tunneled hemodialysis catheter.   SURGEON: Annice NeedyJason S. Dew, MD  ANESTHESIA: Local with moderate conscious sedation.   ESTIMATED BLOOD LOSS: Approximately 50 mL.   FLUOROSCOPY TIME: Less than one minute.   CONTRAST USED: No contrast was used.    INDICATION FOR PROCEDURE: 64 year old African American female with end-stage renal disease. She also has pancreatic cancer. They have opted for continued aggressive care with dialysis and Perm-A-Cath will be placed.   DESCRIPTION OF PROCEDURE: The patient is brought to the vascular interventional radiology suite. Left neck and chest sterilely prepped and draped, sterile surgical field was created. The left jugular vein was visualized, found to be widely patent. It was then accessed under direct ultrasound guidance without difficulty with a Seldinger needle. J-wire was placed. After skin nick and dilatation, the peel-away sheath was placed over the wire and the wire and dilator were removed. Then anesthetized below the left clavicle, tunneled from the subclavicular incision to the access site. Using fluoroscopic guidance a 23 cm tip to cuff tunneled hemodialysis catheter was selected, placed through the peel-away sheath and the peel-away sheath was removed. The catheter tips were parked in excellent location in the right atrium without kink and the appropriate distal connectors were placed. It withdrew blood well and flushed easily with heparinized saline and concentrated heparin solution was then placed. It was secured to the  chest wall with two Prolene sutures. A 4-0 Monocryl pursestring was placed at the access site and around the exit site. A sterile dressing was placed. The patient tolerated procedure well and was taken to the recovery room in stable condition.   ____________________________ Annice NeedyJason S. Dew, MD jsd:cms D: 07/28/2012 15:30:43 ET T: 07/28/2012 16:03:08 ET JOB#: [id number]  cc: Annice NeedyJason S. Dew, MD, <Dictator>  Annice NeedyJASON S DEW MD ELECTRONICALLY SIGNED 07/28/2012 18:13

## 2015-01-08 NOTE — Consult Note (Signed)
PATIENT NAME:  Carmen Gibson, Carmen Gibson MR#:  [id number] DATE OF BIRTH:  06-Feb-1951  DATE OF CONSULTATION:  07/17/2012  REFERRING PHYSICIAN:   CONSULTING PHYSICIAN:  Pauletta BrownsYuriy Ignace Mandigo, MD  NOTE: Patient was seen in Critical Care Unit bed 14. Case was discussed with the family who were in the waiting area as well as the nursing staff.   HISTORY OF PRESENT ILLNESS: This is a 64 year old female with past medical history of uncontrolled hypertension, history of pancreatic cancer that was diagnosed about a year ago and has been on chemotherapy, last chemotherapy about six weeks ago. As far as the patient knows there is no history of metastasis but she does not know when the last scan, such as PET scan or other forms of imaging. Patient also has history of acute kidney injury on chronic kidney injury, was discharged from the hospital two days ago post dialysis. At this time patient was brought in to the hospital for anuria, acute on chronic renal failure and seizure. It was difficult to obtain information from the family in terms of what type of seizure it was but they believe it was tonic-clonic seizure activity at home with drooling from patient's right side of her face. There is no report of tongue biting. No urinary incontinence. Patient was confused for a significant period of time after the seizure. When patient was brought to the Emergency Department apparently she had another seizure activity in the Emergency Department. There she was loaded on Keppra 1 gram and no further antiepileptics were given. Currently patient is able to converse with me, tells me where she is but she is unable to tell me exact details of her past medical history. She states she doesn't know. Looking at patient's blood work, her glucose is 131, BUN 33 and creatinine is elevated at 4.36, sodium 127 which is low, potassium 3.6. Elevated liver function such as AST and ALT; AST 189, ALT 120. Her GFR clearance is 12 since she is [place] American.  She has also elevated B-type natriuretic peptide of 34,790. Patient was scheduled to have a CAT scan of her head done today but I don't see the results available as of yet.   FAMILY HISTORY: Noncontributory. Patient states there is no family history of seizure or history of cancer.   SOCIAL HISTORY: Lives with her children who are in the waiting area. She does smoke half a pack per day for the past 30 to 40 years. Denies any alcohol use. Denies any drug use.   PHYSICAL EXAMINATION: VITAL SIGNS: Temperature 98.3, pulse 90, respirations 19, blood pressure 164/115.  NEUROLOGICAL: Patient was not able to tell me the date, time. She was able to tell me that she was [facility]. She was not able to tell me which floor she is on and could not tell me exactly why she is in the hospital but she appeared to be able to follow simple commands. She was able to raise her hands when asked and close her eyes and stick out her tongue. Attention appeared to be intact. On cognition she was able to name two objects for me. When asked how many quarters are in a $1.75 she was also able to tell me the number. Cranial nerve examination extraocular movements appear to be intact. Visual fields appear to be intact bilaterally. No blurred vision. No double vision. Facial sensation symmetrical. No signs of facial droop present. Tongue is midline. Palate elevated symmetrically. Hearing grossly intact. She is able to shrug  her shoulders equally bilaterally. Strength on motor examination the tone appears to be symmetrical and normal. Strength 4+/5 bilateral upper and lower extremities as patient is complaining of generalized weakness. Sensation intact to light touch and temperature bilaterally. No signs of Babinski present. Deep tendon reflexes are 1+ throughout and difficult to obtain at the ankles. Finger to nose coordination appeared to be intact. No signs of dysmetria. Gait was not assessed.   IMPRESSION:  This is a 64 year old female with multiple medical problems including hypertension, acute kidney injury and chronic kidney disease, history of dialysis in the past, last was about two days ago. Patient presented with two seizure episodes suspected to be described as tonic-clonic seizure with postictal state. Seizure cause suspected to be multifactorial. Patient has worsening renal function and suspected seizure activity could be attributed to uremia as well as hyponatremia. Patient was loaded with Keppra 1 gram.  PLAN: Despite the CAT scan having been ordered would still like to see MRI of the brain with gadolinium to see if we have any brain metastasis to see if there are any signs of enhancement. There is a palliative care consult that was called in who will speak to the family members in terms of how aggressive they want the management to be. I spoke to the family briefly and I believe they would want to have an MRI of the brain done and EEG to find out if there is any further suspected epileptogenic focus that could cause seizure activity. In terms of antiepileptic medications, this patient was already given Keppra. Will put her on antiepileptic agents but not Keppra as Keppra is renally excreted and the renal function is very poor with a GFR of 12. Would start her on Dilantin 100 mg q.8 hours which will most likely need to be adjusted in the near future since her liver function is poor as well. Please check Dilantin level in a.m. total and free; would like the free level to be between 1 and 2 and the total between 10 and 20. Will not load Dilantin because patient has been loaded with Keppra, this is going to be in her system for prolonged period of time since she will have difficulty excreting it because of the poor renal function. Would obtain EEG monitoring in the morning. If we can I would like to leave it on for a few hours to see if any epileptogenic activity there. Nephrology to follow the patient,  possible need for dialysis after palliative care sees the patient and determines if they want that aggressive management with frequent dialysis. The rest of the workup is per primary team. Case discussed with family and discussed with the nursing staff.   Thank you. It was a pleasure seeing this patient.     ____________________________ Pauletta Browns, MD yz:cms D: 07/17/2012 11:32:59 ET T: 07/17/2012 12:58:42 ET JOB#: [id number]  cc: Pauletta Browns, MD, <Dictator> Pauletta Browns MD ELECTRONICALLY SIGNED 09/06/2012 19:13

## 2015-01-08 NOTE — Op Note (Signed)
PATIENT NAME:  Carmen NorrieMURRAY, Maecyn V MR#:  [id number] DATE OF BIRTH:  1951/07/24  DATE OF PROCEDURE:  07/13/2012  PREOPERATIVE DIAGNOSES:  1. Acute renal failure.  2. Pancreatic cancer.  3. Thrombotic thrombocytopenia purpura.  POSTOPERATIVE DIAGNOSES: 1. Acute renal failure.  2. Pancreatic cancer.  3. Thrombotic thrombocytopenia purpura.  PROCEDURE PERFORMED:   1. Ultrasound guidance for vascular access, right femoral vein.  2. Placement of right femoral Trialysis type dialysis catheter, 30 cm in length.   SURGEON: Annice NeedyJason S, Ralonda Tartt, MD   ANESTHESIA: Local.   ESTIMATED BLOOD LOSS: 25 mL.   FLUOROSCOPY TIME:  None.   CONTRAST: None.    INDICATION FOR PROCEDURE: A 64 year old [place] female with pancreatic cancer and TTP from chemotherapy with acute renal failure. She needs a dialysis catheter for dialysis access today.   DESCRIPTION OF PROCEDURE: The patient was laid flat in her floor bed. Her right groin was sterilely prepped and draped, and a sterile surgical field was created. The right femoral vein was visualized with ultrasound and found to be widely patent. It was then accessed under direct ultrasound guidance without difficulty with a Seldinger needle. A J-wire was placed, and after skin nick and dilatation a 30 cm long Trialysis-type dialysis catheter was placed over the wire and the wire was removed. All three lumens withdrew dark red nonpulsatile blood and flushed easily with sterile saline. It was secured with 3 nylon sutures at the skin at 30 cm.   ____________________________ Annice NeedyJason S. Ena Demary, MD jsd:cbb D: 07/13/2012 14:54:40 ET T: 07/13/2012 15:08:44 ET JOB#: [id number]  cc: Annice NeedyJason S. Colie Fugitt, MD, <Dictator> Annice NeedyJASON S Layia Walla MD ELECTRONICALLY SIGNED 07/17/2012 9:16

## 2015-01-08 NOTE — H&P (Signed)
PATIENT NAME:  Carmen Gibson, BRAZZLE MR#:  [id number] DATE OF BIRTH:  03-11-1951  DATE OF ADMISSION:  09/19/2012  PRIMARY CARE PHYSICIAN: Clydie Braun, MD  PRIMARY NEPHROLOGIST: Mady Haagensen, MD   PRIMARY ONCOLOGIST: Johney Maine, MD  CHIEF COMPLAINT: Shortness of breath.   HISTORY OF PRESENT ILLNESS: This is a 64 year old female with known past medical history of pancreatic cancer on chemotherapy and status post radiation, as well with recent diagnosis of end-stage renal disease on hemodialysis for the last month, as well as history of hypertension and hyperlipidemia who presents with complaints of shortness of breath. The patient is on hemodialysis Tuesday, Thursday and Saturday. She reports she had her hemodialysis on Saturday without any complication. She had her full session. She reports she has been having shortness of breath all day today. The patient reports her shortness of breath is mainly upon lying flat. As well she is complaining of lower extremity edema. Upon presentation, the patient was initially hypoxic, but currently she is saturating 100% on 2 liters nasal cannula. Her chest x-ray does show some volume overload. As well the patient has been complaining of nausea and vomiting over the last few weeks, but she reports it got worse over the last two days, but it much improved in the ED after she received IV Phenergan and Zofran. The hospitalist service was requested to admit the patient for further management of her respiratory distress as she might need hemodialysis in the a.m. As well, the patient's potassium was found to be at 2.7.   PAST MEDICAL HISTORY: 1. Metastatic pancreatic cancer, treated with chemotherapy and radiation. Last chemotherapy was finished last week. The patient reports she is scheduled to have her next session on 09/27/2012.  2. Hypertension.  3. Recent diagnosis of end-stage renal disease on hemodialysis Tuesday, Thursday and Saturday.  4. Hyperlipidemia.   5. History of COPD. 6. History of lymph node dissection.  7. Allergic rhinitis.  PAST SURGICAL HISTORY: 1. Cesarean section.  2. Port-A-Cath placement.  3. Lymph node dissection.   ALLERGIES: No known drug allergies.   HOME MEDICATIONS: 1. Oxycodone 5 mg 2 tablets every 4 to 6 hours as needed.  2. Pantoprazole 40 mg oral 2 times a day.  3. Promethazine 25 mg oral 1 tablet every 4 hours as needed for nausea and vomiting.  4. Hydralazine 100 mg oral 3 times a day.  5. Pro Air 2 puffs 4 times a day as needed.  6. Norvasc 10 mg oral daily.  7. Diovan 320 mg oral daily.  8. Coreg 25 mg oral p.o. 2 times a day.  9. phenytoin100 mg oral 3 times a day.  10. Clonidine 0.3 patch every Sunday.  11. Creon 2000 units delayed release 1 capsule 3 times a day.   SOCIAL HISTORY: The patient lives in [place] with her two daughters. Smokes 1 to 2 cigarettes per day. Denies any alcohol or illicit drug use.   FAMILY HISTORY: One brother died from pancreatic cancer.   REVIEW OF SYSTEMS:   CONSTITUTIONAL: The patient denies any fever or chills, but complains of generalized weakness and fatigue.   EYES: Denies blurry vision, double vision, pain or glaucoma.   ENT: Denies tinnitus, ear pain, hearing loss or epistaxis.   RESPIRATORY: Denies any cough, wheezing or hemoptysis. Has complaints of shortness of breath mainly upon lying supine.   CARDIOVASCULAR: Denies chest pain. Has worsening lower extremity edema. Denies arrhythmias, palpitations or syncope.   GASTROINTESTINAL: Complains of nausea and vomiting. Denies abdominal  pain, hematemesis, melena or rectal bleed.   GENITOURINARY: Denies dysuria, hematuria or renal colic.   ENDOCRINE: Denies polyuria, polydipsia or heat or cold intolerance.   HEMATOLOGY: Denies easy bruising or bleeding diathesis.   MUSCULOSKELETAL: Denies any gout, cramps, knee pain or hip pain.   NEUROLOGIC: Denies any numbness, dysarthria, epilepsy, tremors,  vertigo, ataxia, headache or migraine.   PSYCHIATRIC: Denies anxiety, insomnia, schizophrenia, nervousness or bipolar disorder.   PHYSICAL EXAMINATION:  VITAL SIGNS: Temperature 98.2, pulse 83, respiratory rate 20, blood pressure 169/93 and saturating 100% on 2 liters nasal cannula.   GENERAL: Frail, elderly female sitting in bed in no apparent distress.   HEENT: Head atraumatic, normocephalic. Pupils equal and reactive to light. Pink conjunctivae. Moist oral mucosa.   NECK: Supple. No thyromegaly. No JVD.   PULMONARY: Good air entry bilaterally with bibasilar crackles.   CARDIOVASCULAR: S1 and S2 heard. No rubs, murmurs or gallops.   ABDOMEN: Mild tenderness in the epigastric area. No rebound. No guarding. Bowel sounds present.   EXTREMITIES: +2 edema bilaterally.   PSYCHIATRIC: Appropriate affect. Awake and alert x 3. Intact judgment and insight.   NEURO: Cranial nerves grossly intact. Motor 5 out of 5. Sensation symmetrical.   SKIN: Normal skin turgor. Warm and dry.   PERTINENT LABS: Glucose 110. BNP 105,334. BUN 16, creatinine 4.46, sodium 132, potassium 2.8, chloride 93, CO2 31, calcium 8, lipase 65, protein 6.1, albumin 2.1, total bilirubin 2.4, alkaline phosphatase 381, AST 68 and ALT 16. Troponin 0.03. Dilantin 5.7. White blood cells 7, hemoglobin 9.1, hematocrit 25.5 and platelets 157.   ASSESSMENT AND PLAN: A 64 year old female who presents with shortness of breath due to volume overload as she has end stage renal disease.  1. Acute respiratory distress. The patient appears comfortable on 2 liters nasal cannula, has evidence of volume overload as she has lower extremity edema and bibasilar crackles and volume overload on chest x-ray. The patient will need hemodialysis in the a.m. We will consult nephrology service.  2. Hypokalemia. This is significantly low at 2.8, even though the patient has end-stage renal disease and is on hemodialysis. We will replace with low dose. We  will give 30 milliequivalents of potassium, especially as the patient is complaining of vomiting. As well she will get hemodialysis in the a.m.  3. Hypertension, uncontrolled. We will continue home medication and will add hydralazine IV as needed.  4. Nausea and vomiting. This is due to her pancreatic cancer. We will continue with p.r.n. Zofran and Phenergan.  5. Elevated LFTs. As they are chronically elevated around baseline, this is most likely related to her pancreatic cancer. 6. Pancreatic cancer. The patient is following with Dr. Doylene Canninghoksi as an outpatient.  7. History of COPD. The patient has no wheezing and will continue with albuterol.  8. DVT prophylaxis. Subcutaneous heparin.  9. GI prophylaxis. On PPI.         CODE STATUS: FULL CODE.   TOTAL TIME SPENT ON ADMISSION AND PATIENT CARE: 55 minutes.  ____________________________ Starleen Armsawood S. Tahara Ruffini, MD dse:sb D: 09/19/2012 04:01:28 ET T: 09/19/2012 13:38:19 ET JOB#: [id number]  cc: Starleen Armsawood S. Drewey Begue, MD, <Dictator> Meili Kleckley Teena IraniS Tamari Busic MD ELECTRONICALLY SIGNED 09/20/2012 7:22

## 2015-01-08 NOTE — Consult Note (Signed)
Patient with multiple issues, ARF.  Asked by Nephrology to see patient for dialysis catheter.  Platelets 48.  Has TTP so cant give platelets.  Will place temporary dialysis catheter at bedside today.  Electronic Signatures: Annice Needyew, Rahmir Beever S (MD)  (Signed on 23-Oct-13 14:09)  Authored  Last Updated: 23-Oct-13 14:09 by Annice Needyew, Kinzleigh Kandler S (MD)

## 2015-01-08 NOTE — Discharge Summary (Signed)
PATIENT NAME:  Carmen Gibson, Carmen Gibson MR#:  [id number] DATE OF BIRTH:  1951/04/29  DATE OF ADMISSION:  07/17/2012 DATE OF DISCHARGE:  08/09/2012  DISCHARGE DIAGNOSES:  1. Acute renal failure without recovery of renal function.  2. Metastatic pancreatic cancer.  3. Accelerated hypertension.   DISCHARGE MEDICATIONS:  New medications:  1. Carvedilol 25 mg b.i.d.  2. Catapres patch TTS 3 weekly.  3. Amlodipine 10 mg daily.  4. Diovan 320 mg daily.  5. Hydralazine 100 mg t.i.d.  6. Dilantin 100 mg, 3 daily.   Old medications: 1. Prednisone 50 mg daily.  2. Pantoprazole 40 mg daily.  3. Norco 5/325, one every six hours p.r.n.  4. Promethazine p.r.n. nausea and vomiting.   HISTORY AND PHYSICAL: Please see detailed History and Physical done on admission.   HOSPITAL COURSE: The patient was admitted with acute renal failure and dialysis was started. She, unfortunately, after a month had no return of renal function and was deemed chronic kidney disease, endstage, needing dialysis. This has been set up as an outpatient. She is ready to be discharged. She was mildly anemic at 8.5. Dilantin level was somewhat low, but she has been stable without seizures on current Dilantin of late. She has otherwise done well of late with some abdominal pain controlled with p.r.n. analgesics. She has Norco at home. We will see how she does with that orally. It can be further titrated up by her oncologist, if needed. She had been taking some Ultram here with p.r.n. IV medications as well. She is anxious and ready to go home. Ultimately her kidney failure was felt to be secondary to chemotherapy usage per the nephrologist. She only had a trickle of urine output, most notably. Her edema was controlled very well with the dialysis. She will need close followup with Nephrology and Oncology as has been set up.   TIME SPENT ON DISCHARGE: With medication usage, seeing the patient in dialysis as well as other discharge tasks took  approximately 35 minutes.   ____________________________ Marya AmslerMarshall W. Dareen PianoAnderson, MD mwa:cbb D: 08/09/2012 08:00:42 ET T: 08/09/2012 10:38:37 ET JOB#: [id number]  cc: Marya AmslerMarshall W. Dareen PianoAnderson, MD, <Dictator> Lauro RegulusMARSHALL W ANDERSON MD ELECTRONICALLY SIGNED 08/10/2012 7:35

## 2015-01-08 NOTE — Op Note (Signed)
PATIENT NAME:  Carmen Gibson, Carmen Gibson MR#:  [id number] DATE OF BIRTH:  19-Dec-1950  DATE OF PROCEDURE:  09/08/2012  PREOPERATIVE DIAGNOSES: 1. End-stage renal disease.  2. Hypertension.   POSTOPERATIVE DIAGNOSES:  1. End-stage renal disease.  2. Hypertension.   PROCEDURE: Left brachial artery axillary vein AV graft.   SURGEON: Annice NeedyJason S. Dew, M.D.   ANESTHESIA: General.   ESTIMATED BLOOD LOSS: Approximately 25 mL.   INDICATION FOR PROCEDURE: This is a 64 year old African American female with end-stage renal disease. She currently is catheter dependent and an AV graft will be placed for more permanent dialysis access. The risks and benefits were discussed and informed consent was obtained.   DESCRIPTION OF PROCEDURE: The patient is brought to the operative suite and, after an adequate level of general anesthesia was obtained, the left upper extremity was sterilely prepped and draped and a sterile surgical field was created. A small incision was created at the  antecubital fossa. We dissected out the brachial artery, which was on the small side, but a reasonably normal artery and did appear as if it would be adequate for graft creation. The brachial vein, at the antecubital fossa, was not adequate for a loop forearm AV graft so I moved to the axillary location. An incision was created in the upper arm, dissected out the axillary or brachial vein in the upper arm and prepared this for control with bulldog clamps. I then tunneled a 6 mm standard wall Propaten PTFE graft and gave the patient 3000 units of intravenous heparin for systemic anticoagulation. Control was pulled up on the artery. An anterior wall arteriography was created with an 11 blade and extended with Potts scissors and anastomosis was created with a running CV-6 suture in the usual fashion. We flushed through the graft with good pulsatile inflow. I then turned our attention to the venous anastomosis. Control was created with bulldog clamps,  the vein was marked for orientation and an anterior wall venotomy was created with an 11 blade and extended with Potts scissors. I cut and beveled the graft to match the venotomy and created an anastomosis with a running CV-6 suture. A single CV-6 suture was used for a patch suture at the end. The vessel was flushed and de-aired prior to release of control. On release there was a nice soft thrill with pulse in the AV graft. Surgicel and Evicyl topical hemostatic agents were placed and hemostasis was complete. The wound was then closed with a running 3-0 Vicryl and 4-0 Monocryl and Dermabond was placed as a dressing. The patient tolerated the procedure well and was taken to the recovery room in stable condition.  ____________________________ Annice NeedyJason S. Dew, MD jsd:sb D: 09/08/2012 17:59:16 ET T: 09/09/2012 09:00:53 ET JOB#: [id number]  cc: Annice NeedyJason S. Dew, MD, <Dictator> Annice NeedyJASON S DEW MD ELECTRONICALLY SIGNED 09/09/2012 18:37

## 2015-01-08 NOTE — H&P (Signed)
PATIENT NAME:  Carmen Gibson, Carmen Gibson MR#:  [id number] DATE OF BIRTH:  06/22/1951  DATE OF ADMISSION:  07/17/2012  ADDENDUM:  After admitting the patient for management of hypotension, [facility].  Eventually the patient had one episode of tonic-clonic seizure, which is new.  The patient does not have any past history of seizures.  Blood pressure went up to 250/150.  Stat CT scan of the head is ordered.  Neurology consult is placed.  Our concern is to rule out brain mets in view of malignant pancreatic cancer.  We will get neuro checks.  Keppra 1 gram IV loading dose was given in the ER.  The patient will get Ativan 2 mg IV on an as-needed basis.  We will upgrade the patient to the Intensive Care Unit for close monitoring.  Family members were at the bedside.  The diagnosis and plan of care were discussed with them.  The patient is currently in a postictal phase.  CRITICAL CARE TIME SPENT:  30 minutes.  ____________________________ Ramonita LabAruna Mckade Gurka, MD ag:bjt D: 07/17/2012 07:03:06 ET T: 07/17/2012 13:51:36 ET JOB#: [id number]  cc: Ramonita LabAruna Jalea Bronaugh, MD, <Dictator> Tollie Pizzaimothy J. Orlie DakinFinnegan, MD Ramonita LabARUNA Kasir Hallenbeck MD ELECTRONICALLY SIGNED 07/19/2012 4:05

## 2015-01-11 NOTE — Discharge Summary (Signed)
PATIENT NAME:  Carmen Gibson, Carmen Gibson MR#:  [id number] DATE OF BIRTH:  [birth year]  DATE OF ADMISSION:  11/10/2012 DATE OF DISCHARGE:  11/13/2012  HISTORY OF PRESENT ILLNESS: The patient is a 64 year old black lady with known advanced metastatic pancreatic cancer who is being treated for end-stage renal disease that had resulted from her chemotherapy. She had just been discharged 3 days prior to the present admission for nausea, vomiting, and abdominal distention. During that admission, her hemodialysis catheter had been removed, and she was getting dialysis through an AV graft in the left upper extremity. She went to the dialysis center on the day of admission and had a shortened session due to fistula graft failure. She was transferred to the Emergency Room because of hypoxia and volume overload. The patient was seen by Dr. Gilda Crease, from vascular surgery, who did repair the fistula.   PAST MEDICAL HISTORY: Notable for metastatic pancreatic cancer, end-stage renal disease with dialysis on Tuesday, Thursday, Saturday, hypertension, hyperlipidemia, COPD and allergic rhinitis.   PAST SURGICAL HISTORY:  Previous C-section. She had had a previous lymph node dissection. She had had a Port-A-Cath placement.   ALLERGIES: No known drug allergies.   MEDICATIONS ON ADMISSION: ProAir 2 puffs 4 times a day as needed, oxycodone 10 mg 4 times a day, Ambien 5 mg at bedtime p.r.n., Protonix 40 mg b.i.d., Dilantin 100 mg t.i.d., Benadryl 25 mg every 4 to 6 hours as needed and Imodium 1 tablet 4 times a day as needed.   PHYSICAL EXAMINATION:  VITAL SIGNS: On admission, blood pressure was 120/70 and a pulse of 70. Pulse oximetry was 80.  GENERAL: She was described as being an acutely and chronically ill-appearing lady. She had decreased breath sounds bilaterally but no rales or rhonchi.  CARDIAC: Regular rhythm without murmur or gallop.  ABDOMEN: Distended. She did have hepatosplenomegaly.  EXTREMITIES: There was 3+ edema  of the upper extremity. She also had sacral edema.  NEUROLOGICAL:  She had a flat affect, but the neurological exam was unremarkable.  LABORATORY AND RADIOLOGICAL DATA: The patient's admission metabolic panel showed a BUN of 22, with a creatinine of 4.41. Sodium was 131. Potassium was 3.4. Chloride was 94.0. Estimated GFR was 12.0. Phosphorus was 4.4. Admission CBC showed a hemoglobin of 9.4 with a hematocrit of 29.4. White count was 6000. Platelet count was 163,000. Chest x-ray showed a persistent haziness at the right lung base but was otherwise unremarkable. Ultrasound of the fistula of the left upper extremely showed it to be nearly totally occluded.   HOSPITAL COURSE: The patient was admitted initially through observation, where she did have her fistula repair by Dr. Gilda Crease. She underwent dialysis the following day. She was seen in consultation by her oncologist, who did order an ultrasound of the abdomen because of abnormal liver function studies, rule out bile duct dilatation. 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 The liver was enlarged. Ascites was present. There was sludge within the gallbladder. There was a right pleural effusion. The bile duct was not dilated.   The patient tolerated her dialysis well. At the time of discharge, she was tolerating her diet. She is cared for by her daughter at home.   DISCHARGE DIAGNOSIS: Acute volume overload secondary to failed dialysis.   PROCEDURE:  1. Hemodialysis fistula repair.  2. Hemodialysis.   DISCHARGE MEDICATIONS: 1. Ambien 5 mg at bedtime p.r.n.  2. Protonix 40 mg b.i.d.  3. Dilantin 100 mg t.i.d.  4. Chlorpheniramine hydrocodone 5  mL every 12 hours as needed.  5. Zofran 4 mg, 1 every 6 hours as needed.  6. Promethazine 25 mg, 1 every 4 hours as needed.  7. Norco 325/5, 1 tablet every 6 hours as needed.  8. Remeron 15 mg at bedtime.  9. Hydralazine 100 mg 3 times a day.  10. Diovan 320 mg daily.  11. Coreg 1 tablet  b.i.d.  12. Amlodipine 1 tablet daily.  13. Furosemide 1 tablet daily.    PLAN: The patient is being discharged on a renal failure diet. She is to have activity as tolerated. She will be following up for her regular dialysis on Tuesday. It should be noted that her potassium was 3.2 on the day of discharge. She was given 1 dose of potassium chloride 20 mEq prior to discharge. The patient will be following up with her oncologist this coming Wednesday. She is to see Dr. Sampson GoonFitzgerald, her PCP, on a p.r.n. basis.   ____________________________ Carmen PateJohn B. Danne HarborWalker III, MD jbw:cb D: 11/13/2012 10:30:00 ET T: 11/13/2012 17:06:06 ET JOB#: [id number]  cc: Jonny RuizJohn B. Danne HarborWalker III, MD, <Dictator> Stann Mainlandavid P. Sampson GoonFitzgerald, MD Elmo PuttJOHN B WALKER III MD ELECTRONICALLY SIGNED 11/28/2012 6:15

## 2015-01-11 NOTE — H&P (Signed)
PATIENT NAME:  Carmen Gibson, Carmen Gibson MR#:  [id number] DATE OF BIRTH:  Jan 27, 1951  DATE OF ADMISSION:  11/29/2012  PRIMARY CARE PHYSICIAN: Dr. Sampson Goon  ONCOLOGIST: Dr. Doylene Canning REFERRING PHYSICIAN: Dr. Brien Mates  CHIEF COMPLAINT: Weakness.   HISTORY OF PRESENT ILLNESS: The patient is a 64 year old African American female with history of metastatic pancreatic cancer with mets to the liver, on chemoradiation, who sees Dr. Doylene Canning, with end-stage renal disease and dialysis, hypertension, hyperlipidemia, COPD, who presents with a 2-day history of lethargy, weakness, more so than her baseline. The patient has had poor p.o. intake recently and not eating much, per her family members. She started to have some dizziness and blurry vision today. She came in here instead of going to her dialysis session because of the above reason and was noted to have significant hypoglycemia. Initial blood sugar was 32. She has received an amp of D10. She still feels lethargic, is altered per family. She has also a UTI. Hospitalist services were contacted for further evaluation and management. The patient at this point is unable to provide a reliable history given her confusion. Although she knows she is in the hospital, the patient is confused.   PAST MEDICAL HISTORY:  1. Metastatic pancreatic cancer, mets to the liver, last chemo was a couple of weeks ago.   2. End-stage renal disease, on dialysis. 3. Hypertension.  4. Hyperlipidemia.  5. COPD. 6. Allergic rhinitis.   PAST SURGICAL HISTORY: 1. Cesarean section. 2. Lymph node dissection.  3. Port-A-Cath placement.   ALLERGIES: No known drug allergies.   OUTPATIENT MEDICATIONS: Ambien 5 mg at bedtime, Benadryl 25 mg 2 tabs every 6 hours, chlorpheniramine/hydrocodone 8 mg/10 mg per 5 mL extended release 5 mL every 12 hours, Lomotil 2.5/0.025 mg 1 tab every 6 hours, Norco 1 tab every 6 hours p.r.n., pantoprazole 40 mg 2 times a day, Phenytoin 100 mg extended release 1 cap 3  times a day, Remeron 7.5 mg at bedtime, Zofran 4 mg every 6 hours as needed for nausea or vomiting.   SOCIAL HISTORY: She lives with her family. Smokes a pack a day. Denies alcohol or drug use.    FAMILY HISTORY: Per family, brother died from pancreatic cancer.   REVIEW OF SYSTEMS: Unable to fully obtain given the confusion.  CONSTITUTIONAL: However, the patient denies having any fevers, has weakness and is more bedbound in the last week or so with fatigue.  EYES: Had blurry vision today.   RESPIRATORY: Denies cough, no shortness of breath.  CARDIOVASCULAR: Denies chest pain.  GASTROINTESTINAL: Has some nausea and some chronic diarrhea.  GENITOURINARY: Denies dysuria, still has some urinary output.  HEMATOLOGIC/LYMPHATIC: Has anemia and history of thrombocytopenia.  NEUROLOGICAL: Has global weakness.   PHYSICAL EXAMINATION: VITAL SIGNS: No temperature noted on arrival. Pulse was 87, respiratory rate 14, blood pressure 131/73, O2 sat was 97% on oxygen.  GENERAL: The patient is an elderly [place] American female who appears older than actual age lying in bed in no obvious distress.  HEENT: Normocephalic, atraumatic. Pupils are equal and reactive. Icteric sclerae. There is temporal wasting. Extraocular muscles are intact. There are dry mucous membranes.  NECK: Supple. No thyroid tenderness. No cervical lymphadenopathy.  CARDIOVASCULAR: S1, S2, regular rate and rhythm. No murmurs, rubs, or gallops.  LUNGS: Scattered crackles in the bases.  There is a right-sided Chemo-Port in place without surrounding erythema or redness.  ABDOMEN: Soft, hypoactive, a little distended. Hypoactive bowel sounds. No significant tenderness to palpation.  EXTREMITIES: 2+ edema to  below the knees.  SKIN: No obvious rashes.  NEUROLOGICAL:  Cranial nerves II through XII appear to be grossly intact. Strength is 4+ out of 5 for the upper extremities and 3+ to 4 in lower extremities.  PSYCHIATRIC: Awake, alert, oriented  x 2. Pleasant, cooperative.   LABORATORY AND RADIOLOGICAL DATA:  UA is positive with 2+ blood and leukocyte esterase with 264 RBCs, 7724 WBC, 3+ bacteria.  WBC is 8.7, hemoglobin 9.6, platelets 189. Troponin negative. Albumin is 1.2, bilirubin is 2.7, alkaline phos 590, AST 54, ALT 12, BUN 23, creatinine 4.95, last glucose of 32, potassium 3.7.  EKG: Normal sinus rhythm, left atrial enlargement. Nonspecific T wave changes, no acute ST elevations or depressions. There are some T wave inversions in V1, V2 and V3. CT of the head without contrast showing no evidence of intracranial mass or intracranial edema. No acute ischemic or hemorrhagic event.   ASSESSMENT AND PLAN: We have a 64 year old African American female with pancreatic cancer with metastasis to the liver, last chemo 2 weeks ago, chronic obstructive pulmonary disease, hypertension, end-stage renal disease on dialysis, with progressive lethargy, failure to thrive, weakness, confusion for several days as well as blurry vision and lightheadedness today. The patient was noted to have significant hypoglycemia as well as evidence for urinary tract infection. At this point, we would start the patient on D10 and check finger sticks q. 2 hours and look for inciting factors including a UTI. The patient has a positive UA with some urine cultures. The patient likely has metabolic encephalopathy and also possibly infectious etiology as well as the UTI. She has also poor p.o. intake, not eating much and dehydrated, which contributed to the hypoglycemia, She would require a swallow evaluation, physical therapy. I would evaluate her but put her on a full diet for now. I would consult Nephrology for resumption of dialysis. I would start her on ceftriaxone.  A dose has been given in the ER. Her blood pressure is stable, and given she is not eating much and dehydrated, I would not continue the blood pressure medications at this point and slowly reintroduce them as blood  pressure tolerates. Given the overall poor prognosis and poor functional and performance status, I do not know if she is a candidate for further chemo. I would obtain a Palliative Care consult and consult her primary oncologist for further care and evaluation.   CODE STATUS:  The patient is FULL CODE for now.           TOTAL TIME SPENT: 60 minutes.   ____________________________ Krystal EatonShayiq Ahmadzia, MD sa:cb D: 11/29/2012 15:46:26 ET T: 11/29/2012 15:59:02 ET JOB#: [id number]  cc: Krystal EatonShayiq Ahmadzia, MD, <Dictator> Stann Mainlandavid P. Sampson GoonFitzgerald, MD Gerome SamJanak K. Doylene Canninghoksi, MD Krystal EatonSHAYIQ AHMADZIA MD ELECTRONICALLY SIGNED 12/13/2012 13:21

## 2015-01-11 NOTE — Consult Note (Signed)
CHIEF COMPLAINT and HISTORY:  Subjective/Chief Complaint N/V/D abdominal pain   History of Present Illness Pt admitted on 11/05/12 with N/V/D and abdominal pain. Has metastatic pancreatic cancer on chemotherapy. Also has ESRD on hemodialysis. Had left brachial artery to axillary vein AVG placed by Dr. Wyn Quakerew 09/08/12. S/p thrombolytics, thrombectomy, PTA and stent to AVG on 10/17/12. Since this procedure about 3 weeks ago dialysis has been going well with the graft. Pt was seen while at dialysis today. We were consulted for removal of her perm cath before likely discharge tomorrow.   PAST MEDICAL/SURGICAL HISTORY:  Past Medical History:   Renal Failure:    Seizures:    Cancer, Head of Pancreas:    HTN:    C-Section:    Lymph Node Dissection:   ALLERGIES:  Allergies:  No Known Allergies:   HOME MEDICATIONS:  Home Medications: Medication Instructions Status  chlorpheniramine-HYDROcodone 8 mg-10 mg/5 mL oral suspension, extended release 5 mL orally every 12 hours, As Needed Active  pantoprazole 40 mg oral delayed release tablet 1 tab(s) orally 2 times a day Active  oxyCODONE 10 mg oral tablet 1 tab(s) orally every 4 hours Active  ProAir HFA 90 mcg/inh inhalation aerosol 2 puff(s) inhaled 4 times a day as needed for sob Active  Ambien 5 mg oral tablet 1 tab(s) orally once a day (at bedtime) Active  phenytoin 100 mg oral capsule, extended release 1 cap(s) orally 3 times a day Active  Benadryl 25 mg oral capsule 1 cap(s) orally every 4 to 6 hours for nasal allergies Active  Imodium A-D 2 mg oral tablet 1 tab(s) orally every 4 hours, As Needed- for Diarrhea  Active  Megace 40 mg/mL oral suspension 10 milliliter(s) orally 2 times a day Active   Family and Social History:  Family History COPD   Social History negative tobacco, negative ETOH   Review of Systems:  Fever/Chills No   Cough No   Constipation No   Nausea/Vomiting Yes   Chest Pain No   Dysuria No   Physical  Exam:  GEN no acute distress, currently getting dialysis   NECK supple  No masses   RESP normal resp effort  clear BS   CARD regular rate  no murmur   VASCULAR ACCESS AV graft present  Good bruit  Good thrill  perm cath in place   ABD denies tenderness  normal BS   EXTR negative edema   SKIN normal to palpation   NEURO cranial nerves intact, cranial nerves deficit   LABS:  Laboratory Results: Routine Micro:    15-Feb-14 06:58, Blood Culture  Micro Text Report   BLOOD CULTURE    COMMENT                   NO GROWTH IN 18-24 HOURS     ANTIBIOTIC  Culture Comment   NO GROWTH IN 18-24 HOURS   Result(s) reported on 06 Nov 2012 at 06:50AM.  Routine Chem:    16-Feb-14 05:32, CBC Profile  Result Comment   smear - SMEAR SCANNED   Result(s) reported on 06 Nov 2012 at 07:10AM.  Routine Hem:    15-Feb-14 06:58, CBC Profile  WBC (CBC) 9.1  RBC (CBC) 3.19  Hemoglobin (CBC) 9.5  Hematocrit (CBC) 28.9  Platelet Count (CBC) 170  Result(s) reported on 05 Nov 2012 at 07:42AM.  MCV 90  MCH 29.8  MCHC 32.9  RDW 20.4  Segmented Neutrophils 75  Lymphocytes 16  Monocytes 8  Basophil 1  Diff Comment 1   ANISOCYTOSIS  Diff Comment 2   POIKILOCYTOSIS  Diff Comment 3   POLYCHROMASIA  Diff Comment 4   TARGET CELLS  Diff Comment 5   PLTS VARIED IN SIZE   Result(s) reported on 05 Nov 2012 at 07:42AM.    16-Feb-14 05:32, CBC Profile  WBC (CBC) 8.1  RBC (CBC) 2.71  Hemoglobin (CBC) 8.2  Hematocrit (CBC) 24.5  Platelet Count (CBC) 185  MCV 90  MCH 30.1  MCHC 33.3  RDW 20.5  Neutrophil % 73.3  Lymphocyte % 11.4  Monocyte % 13.6  Eosinophil % 0.8  Basophil % 0.9  Neutrophil # 6.0  Lymphocyte # 0.9  Monocyte # 1.1  Eosinophil # 0.1  Basophil # 0.1    16-Feb-14 10:21, CBC Profile  WBC (CBC) 9.1  RBC (CBC) 2.90  Hemoglobin (CBC) 8.9  Hematocrit (CBC) 26.2  Platelet Count (CBC) 186  MCV 91  MCH 30.6  MCHC 33.8  RDW 20.5  Neutrophil % 48.4  Lymphocyte % 44.4   Monocyte % 6.0  Eosinophil % 0.7  Basophil % 0.5  Neutrophil # 4.4  Lymphocyte # 4.1  Monocyte # 0.5  Eosinophil # 0.1  Basophil # 0.0  Result(s) reported on 06 Nov 2012 at 01:12PM.   ASSESSMENT AND PLAN:  Assessment/Admission Diagnosis Pt admitted on 11/05/12 with N/V/D and abdominal pain. Has metastatic pancreatic cancer on chemotherapy. Also has ESRD on hemodialysis. Had left brachial artery to axillary vein AVG placed by Dr. Wyn Quaker 09/08/12. S/p thrombolytics, thrombectomy, PTA and stent to AVG on 10/17/12. Since this procedure about 3 weeks ago dialysis has been going well with the graft. Pt was seen while at dialysis today. We were consulted for removal of her perm cath before likely discharge tomorrow.  We will plan for perm cath removal tomorrow 2/17 at 8am. Discussed the procedure with patient and all questions answered.   Electronic Signatures: Governor Specking (PA-C)  (Signed 16-Feb-14 14:31)  Authored: Chief Complaint and History, PAST MEDICAL/SURGICAL HISTORY, ALLERGIES, HOME MEDICATIONS, Family and Social History, Review of Systems, Physical Exam, LABS, Assessment and Plan   Last Updated: 16-Feb-14 14:31 by Governor Specking (PA-C)

## 2015-01-11 NOTE — Op Note (Signed)
PATIENT NAME:  Carmen Gibson, MADRUGA MR#:  [id number] DATE OF BIRTH:  1951/03/24  DATE OF PROCEDURE:  11/11/2012  PREOPERATIVE DIAGNOSES: 1.  Thrombosed left arm brachial axillary dialysis graft.  2.  Central venous stenosis.  3.  Deep vein thrombus, acute.  4.  End-stage renal disease requiring hemodialysis.   POSTOPERATIVE DIAGNOSIS: 1.  Thrombosed left arm brachial axillary dialysis graft.  2.  Central venous stenosis.  3.  Deep vein thrombus, acute.  4.  End-stage renal disease requiring hemodialysis  PROCEDURES PERFORMED: 1.  Contrast injection of left arm brachial axillary dialysis graft.  2.  Mechanical thrombectomy left arm brachial axillary dialysis graft.  3.  Percutaneous transluminal angioplasty and stent placement, left innominate vein.  4.  Percutaneous transluminal angioplasty and stent placement, left venous anastomosis brachial axillary dialysis graft.  5.  Percutaneous transluminal angioplasty, arterial anastomosis left brachial axillary dialysis graft.   SURGEON: Renford Dills, M.D.   SEDATION: Versed 4 mg plus fentanyl 150 mcg administered IV. Continuous ECG, pulse oximetry and cardiopulmonary monitoring is performed throughout the entire procedure by the interventional radiology nurse. Total sedation time was 1 hour, 20 minutes.   ACCESS:  1.  6 French sheath left brachial axillary dialysis graft, retrograde direction.  2.  7 French sheath left brachial axillary dialysis graft, antegrade direction.   CONTRAST USED: Isovue 55 mL.   FLUOROSCOPY TIME: 8.4 minutes.   INDICATIONS: The patient is a 64 year old woman who presented to dialysis with loss of thrill and bruit in her left arm graft. She was subsequently transferred to the Emergency Room and is now undergoing evaluation with the hope for thrombectomy. The patient has also been complaining of acute episodes of shortness of breath. The risks and benefits for angiography and intervention were reviewed. All  questions answered. The patient agrees to proceed.   DESCRIPTION OF PROCEDURE: The patient is taken to special procedures and placed in the supine position. After adequate sedation is achieved, the left arm is extended palm upward and prepped and draped in a sterile fashion. Access to the graft is then obtained near the arterial anastomosis in an antegrade direction with a micropuncture needle, microwire followed micro sheath, J-wire followed by a 6 French sheath. A small hand injection of contrast is utilized to demonstrate thrombus within the graft and 3000 units of heparin is given. A floppy Glidewire and a KMP catheter is then negotiated into the central venous system, where injection of contrast is utilized to demonstrate the central venous anatomy. Mobile thrombus is noted in the innominate and another 2000 units of heparin is given.   After allowing the heparin to circulate a 14 x 50 LIFESTAR stent is deployed across the thrombus, jailing it. A 10 x 4 mm balloon is then used to post dilate the stent. This remained under sized and a 12 x 4 balloon is then used to dilate the stent as well as the moderate stenosis at its proximal margin. This may have been allowing the thrombus to form in this location.  Follow-up angiography after the dilatation demonstrated there is approximately an 8 mm lumen at this level where the confluence of the innominate veins from the right and left come together, forming the superior vena cava. This does not appear to be flow limiting and I do not feel that adding another stent that extends out into the superior vena cava would be appropriate at this time. Attention is then turned to the graft itself. Trerotola device is utilized and multiple  passes are made in the venous direction and after 4 or so passes, there is essentially elimination of the thrombus within the graft on the venous half.   Then, 1% lidocaine was infiltrated into the soft tissues, more proximally on the arm  and access to the graft is obtained in the retrograde direction. Microwire followed by micro sheath, J-wire followed by a 6 French sheath. Trerotola device is then advanced out into the arterial system, basket is opened. It is then withdrawn back into the graft before it is engaged. Once within the graft, it is engaged and the thrombus within the arterial half is morcellated. One or 2 more passes are made on the venous side after this and now hand injection of contrast demonstrates there is flow throughout the graft. These images also demonstrate that the venous anastomosis demonstrates a high-grade stenosis and a 7 x 4 balloon is utilized to angioplasty this area. There is minimal, if any improvement and therefore an 8 x 50 flared stent is deployed across this lesion. Following deployment of the stent, delivery system was removed and a 7 [place] sheath is advanced over the wire in the antegrade direction. The stent is then postdilated to 7 mm. During postdilatation with the balloon inflated, reflux of contrast is utilized through the graft,  demonstrating a high-grade stenosis at the arterial portion. This is then ballooned with the 7 balloon as well as extending it up to the anastomosis. The inflation here is to 14 atmospheres for 1 full minute. Follow-up angiography with compression more proximally demonstrates resolution of the stenosis. Forward flow with contrast demonstrates the FLAIR stent is resolved the venous outflow stenosis. Central vein is once again imaged, demonstrating wide patency of the stent within the left innominate.   Pursestring sutures of 4-0 Monocryl are placed at both sheath sites, sheaths were removed, pressure is held and there are no immediate complications.   INTERPRETATION: Images of the central veins after confirming thrombus within the graft, demonstrate what appears to be mobile, assumed to be acute thrombus within the left innominate. This is then caged with a stent and gently  post dilated after heparin has been administered. It is highly suspicious  that her episodes of shortness of breath could be related to small pulmonary emboli originating from this thrombus and this was conveyed to Dr. Sampson GoonFitzgerald by phone. As noted above, mechanical thrombectomy is then performed to the brachial axillary dialysis graft and the stenoses were treated both at the venous anastomosis as well as the arterial anastomosis with excellent result.   SUMMARY: Successful salvage of left arm brachial axillary graft and treatment of a central venous stenosis with acute to subacute thrombus.    ____________________________ Renford DillsGregory G. Schnier, MD ggs:cc D: 11/14/2012 17:48:15 ET T: 11/14/2012 20:56:05 ET JOB#: [id number]  cc: Renford DillsGregory G. Schnier, MD, <Dictator> Stann Mainlandavid P. Sampson GoonFitzgerald, MD Renford DillsGREGORY G SCHNIER MD ELECTRONICALLY SIGNED 11/15/2012 11:24

## 2015-01-11 NOTE — Discharge Summary (Signed)
PATIENT NAME:  Carmen NorrieMURRAY, Der V MR#:  [id number] DATE OF BIRTH:  05-15-1951  DATE OF ADMISSION:  09/19/2012 DATE OF DISCHARGE:  09/23/2012  DISCHARGE DIAGNOSES: 1. End-stage renal disease with volume overload.  2. Urinary tract infection.  3. Fever.  4. Pancreatic cancer, on chemotherapy.  5. Chronic nausea related to pancreatic cancer.  6. Anemia.   HISTORY OF PRESENT ILLNESS: Please see admission history and physical. Basically, this is a 64 year old with known pancreatic cancer and end-stage renal disease who had increasing shortness of breath and orthopnea and was admitted in respiratory distress from volume overload.   HOSPITAL COURSE BY ISSUE:  1. Volume overload. The patient was treated with three hemodialysis sessions with removal of a lot of fluid and this really helped her volume overload. She was able to come off of oxygen.  2. Hypertension. Her blood pressure was initially elevated, but again improved with hemodialysis and adjustments in her medications.  3. Urinary tract infection. Blood cultures and urine cultures were done and blood was negative. Urinalysis was positive, but unfortunately urine culture was not done. Follow-up urine culture just showed Candida albicans.  4. Pancreatic cancer. The patient continues to follow with her oncologist.   DISCHARGE MEDICATIONS: 1. Hydrocodone/ acetaminophen 325/10 mg 1 every 6 hours as needed.  2. Pantoprazole 40 mg once a day.  3. Promethazine p.r.n.  4. Amlodipine 10 mg once a day.  5. Diovan 320 mg once a day.  6. Carvedilol 25 mg twice a day.  7. Phenytoin 100 mg extended-release 1 capsule 3 times daily. 8. Creon 3000 units 3 times a day.  9. Catapres patch one transdermal patch 0.3 mg once a week.  10. ProAir 2 puffs p.r.n.  11. Ciprofloxacin 500 once a day to continue for 7 days.   DISCHARGE DIET: Low salt renal diet.   DISCHARGE FOLLOW-UP: The patient will follow up with Dr. Sampson GoonFitzgerald, Dr. Cherylann RatelLateef and her oncologist,  Dr. Doylene Canninghoksi.  TIME SPENT: 35 minutes.  ____________________________ Stann Mainlandavid P. Sampson GoonFitzgerald, MD dpf:sb D: 10/05/2012 10:32:00 ET T: 10/05/2012 11:41:57 ET JOB#: [id number]  cc: Lennox PippinsMunsoor N. Lateef, MD Gerome SamJanak K. Doylene Canninghoksi, MD Stann Mainlandavid P. Sampson GoonFitzgerald, MD, <Dictator> DAVID Sampson GoonFITZGERALD MD ELECTRONICALLY SIGNED 10/11/2012 19:20

## 2015-01-11 NOTE — Consult Note (Signed)
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 to discuss end of life care   Date of Diagnosis 02-Jul-2011   HPI   64 year old lady with history of carcinoma of pancreas and chronic renal failure  on dialysis was admitted in the hospital with altered mental status and hypoglycemia  Patient's condition has been declining.e had a prolonged discussion colostomy to the tolerating living will.  Chemotherapy was discontinued.patient is gradually declining and poor performance status  PFSH:  Comments No family history of colorectal cancer, breast cancer, or ovarian cancer.   history of leukemia in the family.  History of hypertension in the family.  End diabetes in the family.   Comments patient smokes half pack per day for several years.  Does not drink.   Additional Past Medical and Surgical History COPD Hypertension. Hypercholesterolemia.   Review of Systems:  Performance Status (ECOG) 0   Review of Systems   patient's condition has declined.  Having increasing pain.  Altered mental status.  Hypoglycemia.  Poor oral intake.patient does not voice any complaint is very lethargic.  NURSING NOTES: ED Vital Sign Flow Sheet:   11-Mar-14 13:10   Pulse Pulse: 82   Respirations Respirations: 16   SBP SBP: 106   DBP DBP: 62   Pain Scale (0-10) Pain Scale (0-10): Scale:0   Telemetry pattern Cardiac Rhythm: Normal sinus rhythm   Cardiac Monitor On: Yes   Physical Exam:  General Patient is poorly responsive   Physical Exam Lying in the bed.  Abdominal distention.  Ascites.  Tenderness.  Cardiac: Tachycardia.     dialysis:    Renal Failure:    Seizures:    Cancer, Head of Pancreas:    HTN:    Left brachial artery axillary vein AV graft: Dec 2013   Right [place] Port:    C-Section:    Lymph Node Dissection:    No Known Allergies:     chlorpheniramine-HYDROcodone 8 mg-10 mg/5 mL oral suspension, extended release:  5 milliliter(s) orally every 12 hours, Status: Active, Quantity: 150, Refills: None   Lomotil 2.5 mg-0.025 mg oral tablet: 1 tab(s) orally every 6 hours, Status: Active, Quantity: 60, Refills: None   Zofran 4 mg oral tablet: 1 tab(s) orally every 6 hours, As Needed - for Nausea, Vomiting, Status: Active, Quantity: 60, Refills: None   Norco 325 mg-5 mg oral tablet: 1 tab(s) orally every 6 hours, As Needed - for Pain, Status: Active, Quantity: 120, Refills: None   pantoprazole 40 mg oral delayed release tablet: 1 tab(s) orally 2 times a day, Status: Active, Quantity: 60, Refills: 3   Ambien 5 mg oral tablet: 1 tab(s) orally once a day (at bedtime), Status: Active, Quantity: 0, Refills: None   phenytoin 100 mg oral capsule, extended release: 1 cap(s) orally 3 times a day, Status: Active, Quantity: 0, Refills: None   Remeron 15 mg oral tablet: 0.5 tab(s) orally once a day (at bedtime), Status: Active, Quantity: 0, Refills: None   Benadryl 25 mg oral tablet: 2 tab(s) orally every 6 hours, Status: Active, Quantity: 0, Refills: None  Laboratory Results: TDMs:  12-Mar-14 06:55   Dilantin, Serum 13.2 (Result(s) reported on 30 Nov 2012 at 09:25AM.)  Routine Chem:  12-Mar-14 06:24   Glucose, Serum  127  BUN 13  Creatinine (comp)  3.42  Sodium, Serum  133  Potassium, Serum 3.7  Chloride, Serum  96  CO2, Serum 25  Calcium (Total), Serum  7.9  Anion Gap 12 (Result(s) reported on 30 Nov 2012 at 11:46AM.)  Osmolality (calc) 268  Magnesium, Serum  1.5 (1.8-2.4 THERAPEUTIC RANGE: 4-7 mg/dL TOXIC: > 10 mg/dL  -----------------------)  Routine Hem:  12-Mar-14 06:24   WBC (CBC)  12.2  RBC (CBC)  3.39  Hemoglobin (CBC)  10.1  Hematocrit (CBC)  31.3  Platelet Count (CBC) 198 (Result(s) reported on 30 Nov 2012 at 10:37AM.)  MCV 92  MCH 29.8  MCHC 32.3  RDW  19.5  Bands 1  Segmented Neutrophils 71  Lymphocytes 8  Monocytes 19  Metamyelocyte 1  Diff Comment 1 ANISOCYTOSIS  Diff Comment 2  POIKILOCYTOSIS  Diff Comment 3 TARGET CELLS  Diff Comment 4 HYPOCHROMIA  Diff Comment 5 POLYCHROMASIA  Diff Comment 6 TARGET CELLS  Diff Comment 7 PLTS VARIED IN SIZE  Result(s) reported on 30 Nov 2012 at 10:37AM.   Assessment and Plan: Impression:   1.adenocarcinoma of the pancreas.  Clinically staged T2, N0, M0 tumor.  Plan:    her condition has declined rapidly.had prolonged discussion withnumber the patient's family members (more than 45 minutes) and they are not related to the [facility]: DNR.want to take the patient home with hospice care.  Patient will be switched over to comfort care.  Overall life expectancy is needed.  Electronic Signatures: Laddie Aquas (MD)  (Signed 12-Mar-14 16:02)  Authored: HISTORY OF PRESENT ILLNESS, PFSH, ROS, NURSING NOTES, PE, PAST MEDICAL HISTORY, ALLERGIES, HOME MEDICATIONS, LABS, ASSESSMENT AND PLAN   Last Updated: 12-Mar-14 16:02 by Laddie Aquas (MD)

## 2015-01-11 NOTE — H&P (Signed)
PATIENT NAME:  Carmen Gibson, Carmen Gibson MR#:  [id number] DATE OF BIRTH:  Jan 02, 1951  DATE OF ADMISSION:  11/10/2012  PRIMARY CARE PHYSICIAN:  Stann Mainlandavid P. Sampson GoonFitzgerald, MD  ONCOLOGIST:  Gerome SamJanak K. Choksi, MD  RENAL PHYSICIAN:  Munsoor Lizabeth LeydenN. Lateef, MD  REASON FOR ADMISSION:  Shortness of breath, volume overload after failed hemodialysis.   HISTORY OF PRESENT ILLNESS:  This is a very pleasant, but unfortunate 64 year old woman with known advanced metastatic pancreatic cancer as well as end-stage renal disease likely from chemotherapy. She was just discharged 3 days ago after an admission for nausea, vomiting, abdominal pain, distention and ascites. During that admission, her hemodialysis catheter was removed and she was getting dialysis through an AV graft in her left upper extremity. [facility] went [facility] and [facility] and received chemotherapy starting yesterday with 5-FU. She remains on that today.   She went to dialysis today after having a somewhat shortened dialysis session on Tuesday. The fistula graft is no longer working and she had episodes of hypoxia, shortness of breath and was referred to the Emergency Room for further evaluation and admission. She has already been evaluated by Dr. Gilda CreaseSchnier who will attempt to fix the AV fistula tomorrow.   Currently, the patient reports she is having abdominal pain that comes and goes and a crampy sensation. She has had decreased oral intake today. She has hypoxia and some shortness of breath that comes and goes. No fevers or chills.   PAST MEDICAL HISTORY:   1.  Pancreatic cancer, metastatic, on chemotherapy. Currently getting 5-FU and is on it right now.  2.  End-stage renal disease on dialysis Tuesday, Thursday, Saturday.  3.  Hypertension currently off all medications despite in the past being on 6 different  medications.  4.  Hyperlipidemia.  5.  History of COPD.  6.  Allergic rhinitis.   PAST SURGICAL HISTORY:   1.  C-section.  2.  Lymph node  dissection.  3.  Port-A-Cath placement.   ALLERGIES:  No known drug allergies.   DISCHARGE MEDICATIONS FROM November 07, 2012:  Include:  1.  Pro air 2 puffs 4 times a day as needed.  2.  Oxycodone 10 mg 4 times a day.  3.  Ambien 5 mg once a day.  4.  Pantoprazole 40 mg twice a day.  5.  Phenytoin 100 mg 1 capsule 3 times a day.  6.  Chlorphentermine/hydrocodone 8/10 mg 5 mL every 12 hours.  7.  Benadryl 25 mg q. 4 to 6 hours p.r.n.  8.  Imodium 1 tablet 4 times a day.   SOCIAL HISTORY:  The patient lives with her daughter. She smokes, but does not drink alcohol. She is no longer working.   REVIEW OF SYSTEMS:  Eleven systems reviewed and negative except as per HPI.   PHYSICAL EXAMINATION: VITAL SIGNS: Temperature is afebrile, pulse is 70, blood pressure 120/70 and sats vary, but have been as low as 80%.  GENERAL: She is an ill-appearing female who looks quite tired.  HEENT: Her pupils are equal, round, reactive to light and accommodation. Her extraocular movements are intact. Her sclerae are anicteric. Oropharynx is clear.  NECK: Supple. She has no lymphadenopathy.  HEART: Regular.  LUNGS: She has decreased breath sounds right base.  ABDOMEN: Distended. She has hepatomegaly which is somewhat tender.  EXTREMITIES: She has 3+ edema of bilateral upper extremity. She also has edema on her sacral area.  NEUROLOGIC: She is alert and oriented x 3. Grossly  nonfocal neuro examination.  PSYCHIATRIC: She has a very flat affect.   DIAGNOSTIC DATA:  Blood work is reviewed. Sodium is 128, potassium 3.5, chloride 92, bicarbonate 24, BUN is 18, creatinine 3.93, glucose 118, calcium is 7.6, albumin is 1.6, total protein 5.4, bilirubin 3.1, alkaline phosphatase 691, AST 48, ALT 11. White blood count 8.9, hemoglobin 10.2, platelets 197. INR is 1.1. Chest x-ray shows haziness at the right lung base with some improvement compared to prior. Ultrasound of her AV graft reveals near total occlusion of the  left upper extremity fistula.   IMPRESSION:  A 64 year old admitted with failed dialysis and hypoxia. She has known pancreatic cancer and is on chemotherapy for this. She currently has had some intermittent hypoxia as well as hyponatremia. She has a known seizure disorder.   IMPRESSION:   1.  End-stage renal disease. Emergency Room has spoken with Dr. Gilda Crease who will attempt to fix the graft tomorrow. Unfortunately, her hemodialysis catheter was just removed. There is no urgent need for hemodialysis at this point with potassium being normal and oxygenation stable.  2.  Pancreatic cancer. She is currently getting infusion of 5-FU that will finish tomorrow. Her primary cancer doctor, Dr. Doylene Canning, is out of town.  3.  Hyponatremia. We will fluid restrict.  4.  Seizure disorder. We will restart her Dilantin. She has not had any of it today. She has a risk of seizure with the hyponatremia.  5.  Profound malnutrition with an albumin of 1.6 and anasarca.  6.  Chronic abdominal pain, nausea and vomiting due to her pancreatic cancer. We will put her on her outpatient oxycodone 5 to 10 mg q. 4 to 6 hours p.r.n. We will also do morphine for breakthrough pain.  7.  CODE STATUS. The patient is currently is full code, but we will discuss this further with her and her cancer team.   TIME SPENT:  This admission took a total of 45 minutes.    ____________________________ Stann Mainland. Sampson Goon, MD dpf:si D: 11/10/2012 19:48:00 ET T: 11/10/2012 20:16:33 ET JOB#: [id number]  cc: Stann Mainland. Sampson Goon, MD, <Dictator> Kirstina Leinweber Sampson Goon MD ELECTRONICALLY SIGNED 11/23/2012 19:26

## 2015-01-11 NOTE — Discharge Summary (Signed)
PATIENT NAME:  Carmen NorrieMURRAY, Annissa V MR#:  [id number] DATE OF BIRTH:  Jun 12, 1951  DATE OF ADMISSION:  11/29/2012 DATE OF DISCHARGE:  12/01/2012  PRIMARY CARE PHYSICIAN:  Clydie Braunavid Tristine Langi, MD  ONCOLOGIST:  Dr. Doylene Canninghoksi.  NEPHROLOGIST:  Dr. Cherylann RatelLateef.   DISCHARGE DIAGNOSES: 1.  Metastatic pancreatic cancer, end-stage.  2.  End-stage renal disease.  3.  Hypertension.  4.  Altered mental status.  5.  Hyperglycemia.  6.  Failure to thrive.   HISTORY OF PRESENT ILLNESS: Please see admission history and physical. Briefly, this is a 64 year old woman with metastatic pancreatic cancer failing chemotherapy as well as end-stage renal disease. She has had several recent admissions. She was found hypoglycemic and had altered mental status. She was admitted, seen by palliative care and oncology. A decision was made after discussions with the family for home hospice. Arrangements were made and the patient was discharged home with palliative care. The patient did receive one treatment of dialysis prior to discharge.   DISCHARGE MEDICATIONS: 1.  Ambien 5 mg at bedtime p.r.n.  2.  Zofran 4 mg every four hours as needed p.r.n.  3.  Lomotil 1 tablet every six hours as needed.  4.  Benadryl 25 mg q.6h. p.r.n.  5.  Morphine 20 mg per 5 mL solution 2.5 mL every 4 hours as needed for pain.  6.  Colace 100 mg twice a day.   DISPOSITION: Discharged to home with home hospice.  DISCHARGE OXYGEN:  2 liters per nasal cannula.   DISCHARGE DIET: Regular diet, regular consistency.   FOLLOW UP:  The patient will follow up with Dr. Sampson GoonFitzgerald and Dr. Doylene Canninghoksi if needed.  Palliative care has been arranged for the patient.   This discharge took 35 minutes.    ____________________________ Stann Mainlandavid P. Sampson GoonFitzgerald, MD dpf:ct D: 12/13/2012 13:29:30 ET T: 12/13/2012 14:22:52 ET JOB#: [id number]  cc: Stann Mainlandavid P. Sampson GoonFitzgerald, MD, <Dictator> Terrick Allred Sampson GoonFITZGERALD MD ELECTRONICALLY SIGNED 12/21/2012 15:19

## 2015-01-11 NOTE — Consult Note (Signed)
Brief Consult Note: Diagnosis: Pacreatic cancer.   Comments: Patient not in room when I rounded this pm. Is having fistula repair. Pump will be discontinued and returned to cancer center.  Electronic Signatures: Amadeus Oyama, VesAntony Hastehana S (MD)  (Signed 21-Feb-14 17:16)  Authored: Brief Consult Note   Last Updated: 21-Feb-14 17:16 by Antony Hasteamiah, Xavien Dauphinais S (MD)

## 2015-01-11 NOTE — Op Note (Signed)
DATE OF BIRTH:  10/30/1950  DATE OF PROCEDURE:  10/17/2012  PREOPERATIVE DIAGNOSES:  1.  End-stage renal disease.  2.  Clotted left arm arteriovenous graft.  3.  Pancreatic cancer.   POSTOPERATIVE DIAGNOSES:  1.  End-stage renal disease.  2.  Clotted left arm arteriovenous graft.  3.  Pancreatic cancer.  PROCEDURE:  1.  Ultrasound guidance for vascular access to left arm AV graft in both an antegrade and retrograde fashion crossing.  2.  Catheter directed thrombolysis with 4 mg of TPA delivered with the AngioJet AVX catheter.  3.  Mechanical rheolytic thrombectomy to the graft into the axillary vein with the AngioJet AVX catheter.  4.  Fogarty embolectomy for arterial plug.  5.  Percutaneous transluminal angioplasty of mid graft and a venous anastomosis for residual thrombus causing flow limitation with a 7-mm diameter angioplasty balloon in both locations.  6.  Covered stent placement to the venous anastomosis for extravasation after angioplasty, as well as residual stenosis with an 8-mm diameter Viabahn stent.   SURGEON:  Annice Needy, MD  ANESTHESIA:  Local with moderate conscious sedation.   BLOOD LOSS:  50 mL.   CONTRAST USED:  40 mL Visipaque.   INDICATION FOR PROCEDURE:  A 64 year old [place] American female with end-stage renal disease and late stage pancreatic cancer. Her graft has clotted. She is brought back for an attempt at a declot. Risks and benefits were discussed. Informed consent was obtained.   DESCRIPTION OF THE PROCEDURE:  The patient was brought to the vascular suite. The left upper extremity was sterilely prepped and draped, and a sterile surgical field was created. Due to the pulseless nature of the graft, ultrasound was used to access this in both an antegrade and retrograde fashion crossing. This was done without difficulty with micropuncture needles, and permanent image was recorded. Micropuncture wire and sheaths were placed and then we upsized to 6-French  sheath. The patient was given intravenous heparin for systemic anticoagulation. The graft was thrombosed on the initial imaging, 4 mg of TPA were delivered with the AngioJet AVX catheter, and the Fogarty embolectomy was used to help pullback the arterial plug. This cleared the arterial limb of the graft, but in the mid portion of the graft around the retrograde sheath, there was clot, and there was still a high-grade stenosis and thrombosis of the venous anastomosis.   At this point, the retrograde sheath was removed around a 4-0 Monocryl pursestring suture. Pressure was held. Through the antegrade sheath, a mechanical rheolytic thrombectomy was performed, and angioplasty was performed of the graft and in the mid portion where there was thrombus, residual flow limitation, as well as the venous anastomosis. Following angioplasty, there was still a high-grade residual stenosis, as well as extravasation at the venous anastomosis. The mid portion of the graft was significantly improved without flow limitation at this point. For this lesion, I elected to place an 8-mm diameter x 10-cm length Viabahn stent ironed out with a 7-mm balloon. We had to upsize to a 7-French sheath to perform this. Following this, there was markedly improved flow with brisk traverse of blood out of the AV graft, and the central venous circulation was patent. Both a PermCath and a Port-A-Cath were in place. Now, we elected to terminate the procedure. The sheath was removed around a 4-0 Monocryl pursestring suture. Pressure was held. Sterile dressing was placed. The patient tolerated the procedure well and was taken to the recovery room in stable condition.    ____________________________  Annice NeedyJason S. Colie Josten, MD jsd:ms D: 10/17/2012 15:29:17 ET T: 10/18/2012 00:03:48 ET JOB#: [id number]  cc: Annice NeedyJason S. Quinton Voth, MD, <Dictator> Munsoor Lizabeth LeydenN. Lateef, MD Mosetta PigeonHarmeet Singh, MD  Annice NeedyJASON S Correen Bubolz MD ELECTRONICALLY SIGNED 10/19/2012 7:37

## 2015-01-11 NOTE — Op Note (Signed)
PATIENT NAME:  Carmen NorrieMURRAY, Yuri V MR#:  [id number] DATE OF BIRTH:  1951-07-20  DATE OF PROCEDURE:  11/07/2012  PREOPERATIVE DIAGNOSES: 1. End-stage renal disease.  2. Functional permanent left arm arteriovenous access.  3. Pancreatic cancer.  4. Hypertension.   POSTOPERATIVE DIAGNOSES:  1. End-stage renal disease.  2. Functional permanent left arm arteriovenous access.  3. Pancreatic cancer.  4. Hypertension.   PROCEDURE: Removal of left jugular PermCath.   SURGEON: Festus BarrenJason Dew, MD   ASSISTANT: Rico Junkerhelsea Hanne, Physician Assistant   ANESTHESIA: Local.   ESTIMATED BLOOD LOSS: Minimal.   INDICATION FOR PROCEDURE: This is a 64 year old African American female with end-stage renal disease. Her graft is functional, and her catheter can be removed. The risks and benefits were discussed. Informed consent was obtained.   DESCRIPTION OF PROCEDURE: The patient was laid flat in her floor bed. Her left shoulder, chest and existing catheter were sterilely prepped and draped, and a sterile surgical field was created. The cuff was dissected free after local anesthesia was instilled, gently dissected out from the fibrous connective tissue to the cuff, and then the catheter was removed in its entirety with gentle pressure without difficulty. Pressure was held over the site. Sterile dressing was placed.     The patient tolerated the procedure well and was taken to the recovery room in stable condition.    ____________________________ Annice NeedyJason S. Dew, MD jsd:cb D: 11/07/2012 10:38:17 ET T: 11/07/2012 13:10:36 ET JOB#: [id number]  cc: Annice NeedyJason S. Dew, MD, <Dictator> Annice NeedyJASON S DEW MD ELECTRONICALLY SIGNED 11/07/2012 16:59

## 2015-01-11 NOTE — Discharge Summary (Signed)
PATIENT NAME:  Carmen Gibson, Carmen Gibson MR#:  [id number] DATE OF BIRTH:  21-Jul-1951  DATE OF ADMISSION:  11/05/2012 DATE OF DISCHARGE:  11/07/2012  PRIMARY CARE PHYSICIAN:  Dr. Clydie Braunavid Primrose Oler.   ONCOLOGIST:  Dr. Doylene Canninghoksi.   NEPHROLOGIST:  Dr. Cherylann RatelLateef.  DISCHARGE DIAGNOSES:   1.  Nausea, vomiting, abdominal pain of unclear etiology.  2.  Metastatic pancreatic cancer.   3.  End-stage renal disease on dialysis.  4.  Vaginal bleeding likely due to Megace.  5.  Failure to thrive.   OPERATIONS PERFORMED:  Removal of left hemodialysis catheter.   ADMISSION HISTORY AND PHYSICAL:  Please see admission history and physical for details.  Basically, this is a 64 year old with advanced metastatic pancreatic cancer undergoing chemotherapy as well as end-stage renal disease admitted with abdominal pain, nausea, vomiting.  The patient was admitted and treated for this symptomatically.  CT scan was done which did show some ascites.  No obvious etiology of the symptoms.   HOSPITAL COURSE BY ISSUE:  1.  Abdominal pain, nausea, vomiting, was treated with Zofran.  This improved.  She did have an ultrasound and CAT scan which showed moderate amount of abdominal ascites.  At the time of discharge she was tolerating diet and no longer having any abdominal pain.  2.  Metastatic pancreatic cancer.  She is undergoing chemotherapy and will follow up with oncology.  3.  End-stage renal disease.  She received dialysis while in-patient.  She also had removal of hemodialysis catheter as her fistula is working.  4.  Hypertension.  This had been a serious problem in the past, but currently it is normal off of all antihypertensives.  5.  Failure to thrive.  The patient was started as an outpatient on Megace and Boost.  However due to some vaginal spotting the Megace was held at discharge.  6.  Vaginal spotting.  I will follow up with the patient in 1 to 2 weeks.  We stopped the Megace.  I suspect this is the etiology.   DISCHARGE  MEDICATIONS: 1.  Pro Air 2 puffs 4 times a day as needed.  2.  Oxycodone 10 mg every 4 hours.  3.  Ambien 5 mg once a day.  4.  Pantoprazole 40 twice a day.  5.  Phenytoin 100 mg 3 times a day.  6.  Chlorpheniramine hydrocodone 8/10 5 mL q. 12 hours as needed.  7.  Benadryl 25 mg q. 4 to 6 hours as needed.  8.  Imodium as needed.   9.  She will stop taking the Megace.   DISCHARGE DIET:  Regular consistency, renal diet.   ACTIVITIES:  As tolerated.   FOLLOW-UP:  The patient will follow up with Dr. Sampson GoonFitzgerald in 1 to 2 weeks as well as with her oncologist and will continue to be followed at dialysis.    TIME SPENT:  This discharge took 38 minutes.      ____________________________ Stann Mainlandavid P. Sampson GoonFitzgerald, MD dpf:ea D: 11/09/2012 21:51:46 ET T: 11/09/2012 23:21:54 ET JOB#: [id number]  cc: Stann Mainlandavid P. Sampson GoonFitzgerald, MD, <Dictator> Kameryn Davern Sampson GoonFITZGERALD MD ELECTRONICALLY SIGNED 11/23/2012 19:24

## 2015-01-11 NOTE — Consult Note (Signed)
History of Present Illness:  Reason for Consult Metastatic pancreatic cancer on chemotherapy, now with abdominal pain and swelling.   HPI   Patient last evaluated in clinic on October 26, 2012 when she received chemotherapy with 5-FU, leucovorin, and irinotecan. Patient has had intermin, but states that significantly worse yesterday.  She also feels increased abdominal distention.  She otherws well.  She denies any fevers.  She denies any chest pain or shortness of breath.  She has a poor appetite and has occanal nausea, but denies any vomiting, constipation, or diarrhea.  She has no ints.  Patient otherwise feels well and offers nomplaints.   PFSH:  Additional Past Medical and Surgical History COPD, hypertension, hyperlipidemia.  Social history: Positive tobacco, one half pack per day for several years.  Denies alcohol.  Family history: Hypertension, diabetes.   Review of Systems:  Performance Status (ECOG) 1   Review of Systems   As per HPI. Otherwise, 10 point system review was negative.   NURSING NOTES: **Vital Signs.:   15-Feb-14 06:39   Vital Signs Type: Admission   Temperature Temperature (F): 98.6   Celsius: 37   Temperature Source: oral   Pulse Pulse: 102   Respirations Respirations: 18   Systolic BP Systolic BP: 170   Diastolic BP (mmHg) Diastolic BP (mmHg): 65   Mean BP: 102   Pulse Ox % Pulse Ox %: 92   Pulse Ox Activity Level: At rest   Oxygen Delivery: Room Air/ 21 %   Physical Exam:  Physical Exam General: thin, no acute distress. Eyes: Pink conjunctiva, anicteric sclera. Lungs: Clear to auscultation bilaterally. Heart: Regular rate and rhythm. No rubs, murmurs, or gallops. Abdomen: Soft, mildlydistended. nontender to palpation, normoactive bowel sounds. Musculoskeletal: No edema, cyanosis, or clubbing. Neuro: Alert, answering all questions appropriately. Cranial nerves grossly intact. Skin: No rashes or petechiae noted. Psych: Normal  affect.    No Known Allergies:     chlorpheniramine-HYDROcodone 8 mg-10 mg/5 mL oral suspension, extended release: 5 mL orally every 12 hours, As Needed, Status: Active, Quantity: 150, Refills: None   pantoprazole 40 mg oral delayed release tablet: 1 tab(s) orally 2 times a day, Status: Active, Quantity: 60, Refills: 3   oxyCODONE 10 mg oral tablet: 1 tab(s) orally every 4 hours, Status: Active, Quantity: 90, Refills: None   ProAir HFA 90 mcg/inh inhalation aerosol: 2 puff(s) inhaled 4 times a day as needed for sob, Status: Active, Quantity: 0, Refills: None   Ambien 5 mg oral tablet: 1 tab(s) orally once a day (at bedtime), Status: Active, Quantity: 0, Refills: None   phenytoin 100 mg oral capsule, extended release: 1 cap(s) orally 3 times a day, Status: Active, Quantity: 0, Refills: None   Benadryl 25 mg oral capsule: 1 cap(s) orally every 4 to 6 hours for nasal allergies, Status: Active, Quantity: 0, Refills: None   Imodium A-D 2 mg oral tablet: 1 tab(s) orally every 4 hours, As Needed- for Diarrhea , Status: Active, Quantity: 0, Refills: None   Megace 40 mg/mL oral suspension: 10 milliliter(s) orally 2 times a day, Status: Active, Quantity: 0, Refills: None  Laboratory Results: Hepatic:  15-Feb-14 03:13   Bilirubin, Total  2.9  Alkaline Phosphatase  712  SGPT (ALT)  11  SGOT (AST)  61  Total Protein, Serum  5.4  Albumin, Serum  1.6    06:58   Bilirubin, Total  2.6  Alkaline Phosphatase  698  SGPT (ALT) 12  SGOT (AST)  65  Total Protein,  Serum  5.1  Albumin, Serum  1.6  Routine Chem:  15-Feb-14 03:13   Glucose, Serum 96  BUN 13  Sodium, Serum 136  Potassium, Serum 4.2  Chloride, Serum  97  CO2, Serum 30  Calcium (Total), Serum  7.7  Osmolality (calc) 272  eGFR (African American)  18  eGFR (Non-African American)  16 (eGFR values <50m/min/1.73 m2 may be an indication of chronic kidney disease (CKD). Calculated eGFR is useful in patients with stable renal  function. The eGFR calculation will not be reliable in acutely ill patients when serum creatinine is changing rapidly. It is not useful in  patients on dialysis. The eGFR calculation may not be applicable to patients at the low and high extremes of body sizes, pregnant women, and vegetarians.)  Anion Gap 9  Lipase  43 (Result(s) reported on 05 Nov 2012 at 03:47AM.)    06:58   Glucose, Serum 85  BUN 14  Creatinine (comp)  3.10  Sodium, Serum  131  Potassium, Serum  3.2  Chloride, Serum  94  CO2, Serum 27  Calcium (Total), Serum  7.6  Osmolality (calc) 262  eGFR (African American)  18  eGFR (Non-African American)  16 (eGFR values <685mmin/1.73 m2 may be an indication of chronic kidney disease (CKD). Calculated eGFR is useful in patients with stable renal function. The eGFR calculation will not be reliable in acutely ill patients when serum creatinine is changing rapidly. It is not useful in  patients on dialysis. The eGFR calculation may not be applicable to patients at the low and high extremes of body sizes, pregnant women, and vegetarians.)  Anion Gap 10  Routine Coag:  15-Feb-14 06:58   Prothrombin  14.8  INR 1.1 (INR reference interval applies to patients on anticoagulant therapy. A single INR therapeutic range for coumarins is not optimal for all indications; however, the suggested range for most indications is 2.0 - 3.0. Exceptions to the INR Reference Range may include: Prosthetic heart valves, acute myocardial infarction, prevention of myocardial infarction, and combinations of aspirin and anticoagulant. The need for a higher or lower target INR must be assessed individually. Reference: The Pharmacology and Management of the Vitamin K  antagonists: the seventh ACCP Conference on Antithrombotic and Thrombolytic Therapy. ChYBOFB.5102ept:126 (3suppl): [id number] HCT value >55% may artifactually increase the PT.  In one study,  the increase was an average of  25%. Reference:  "Effect on Routine and Special Coagulation Testing Values of Citrate Anticoagulant Adjustment in Patients with High HCT Values." American Journal of Clinical Pathology 2006;126:400-405.)  Routine Hem:  15-Feb-14 03:13   WBC (CBC) 9.1  RBC (CBC)  3.33  Hemoglobin (CBC)  10.0  Hematocrit (CBC)  30.1  Platelet Count (CBC) 153 (Result(s) reported on 05 Nov 2012 at 03:50AM.)  MCV 90  MCH 30.0  MCHC 33.2  RDW  20.2    06:58   WBC (CBC) 9.1  RBC (CBC)  3.19  Hemoglobin (CBC)  9.5  Hematocrit (CBC)  28.9  Platelet Count (CBC) 170 (Result(s) reported on 05 Nov 2012 at 07:42AM.)  MCV 90  MCH 29.8  MCHC 32.9  RDW  20.4  Segmented Neutrophils 75  Lymphocytes 16  Monocytes 8  Basophil 1  Diff Comment 1 ANISOCYTOSIS  Diff Comment 2 POIKILOCYTOSIS  Diff Comment 3 POLYCHROMASIA  Diff Comment 4 TARGET CELLS  Diff Comment 5 PLTS VARIED IN SIZE  Result(s) reported on 05 Nov 2012 at 07:42AM.   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   Plan:   1. Pancreatic cancer: Patient last received chemotherapy on October 26, 2012 with 5-FU, leucovorin, and irinotecan.  She has been instructed to keep her previously scheduled followup appointment on February 19 for her next treatment. 2.  Abdominal distention/pain: Abdominal ultrasound on November 02, 2012 revealed only a small amount of ascitic fluid, but not enough for paracentesis.  CT scan results from earlier today are pending at time of dictation.  Ultrasound guided paracentesis is scheduled for later today if there is enough ascitic fluid to tap. Renal failure: Continue with dialysis as per nephrology. Anemia: Patient previously responded to Procrit.  Her last injection was on October 26, 2012. Will order iron stores today for completeness.  consult, will follow.  CC Referral:  cc: Lateef   Electronic Signatures: Delight Hoh (MD)  (Signed 15-Feb-14  09:19)  Authored: HISTORY OF PRESENT ILLNESS, PFSH, ROS, NURSING NOTES, PE, ALLERGIES, HOME MEDICATIONS, LABS, ASSESSMENT AND PLAN, CC Referring Physician   Last Updated: 15-Feb-14 09:19 by Delight Hoh (MD)

## 2015-01-11 NOTE — H&P (Signed)
PATIENT NAME:  Carmen Gibson, Carmen Gibson MR#:  [id number] DATE OF BIRTH:  1950-10-22  DATE OF ADMISSION:  11/05/2012  PRIMARY CARE PHYSICIAN: Dr. Sampson Goon.  REFERRING PHYSICIAN: Dr. Zenda Alpers.   CHIEF COMPLAINT: Abdominal pain associated with nausea, vomiting, and diarrhea.   HISTORY OF PRESENT ILLNESS: The patient is a 64 year old female with a past medical history of metastatic pancreatic cancer with METS to the liver on chemoradiation therapy, her last chemo was last Wednesday; end-stage renal disease on hemodialysis, last dialysis was on February 14th; COPD; a chronic history of anemia; hyperlipidemia and hypertension, is presenting to the ER with a chief complaint of acute abdominal pain associated with nausea, vomiting, and diarrhea. The patient suddenly started having severe abdominal pain since yesterday morning. The pain was 10/10 associated with nausea, vomiting, and diarrhea. Denies any blood in her vomit or stool. As the patient's abdominal pain was so severe, she came into the ER. She was given IV pain medicine and a CAT scan of the abdomen and pelvis was done which has revealed massive abdominal and pelvic ascites, noted right-sided pleural effusion and right basilar atelectasis. The patient has received one dose of cefotaxime as an empiric antibiotic for spontaneous bacterial peritonitis. Hospitalist team is called to admit the patient. During my examination, the patient denies any abdominal pain and reporting that the pain medication given in the ER really helped and took the edge off. Her nausea and vomiting has significantly improved but still having dry heaves. The patient is feeling weak and tired. Two daughters are at bedside. They are reporting that the patient's Port-A-Cath is not functioning properly and were not able to use today at the dialysis center. Denies any fever. No similar complaints in the past.   PAST MEDICAL HISTORY:  1. Metastatic pancreatic cancer status post chemoradiation.  The last chemotherapy was on last Wednesday.  2. End-stage renal disease on hemodialysis on Tuesday, Thursday, and Saturday.  3. Hypertension.  4. Hyperlipidemia.  5. History of COPD.  6. Allergic rhinitis.   PAST SURGICAL HISTORY:  1. C-section. 2. Lymph node dissection. 3. Port-A-Cath placement.   ALLERGIES: The patient has no known drug allergies.   HOME MEDICATIONS: ProAir 2 puffs inhalations q.6 hours as needed, Phenergan 100 mg 1 capsule 3 times a day, pantoprazole 40 mg 2 times a day, oxycodone 10 mg 1 tablet p.o. every 4 hours, Megace 40 mg p.o. 2 times a day, Imodium 1 tablet p.o. q.4 hours, chlorpheniramine 5 mL q. 12 hours, Benadryl 25 mg 1 capsule every 4 to 6 hours, Ambien 5 mg 1 tablet p.o. once a day.   PSYCHOSOCIAL HISTORY: Lives at home, and the patient's elder daughter lives with her. Smokes a half to 1 pack a day. Denies alcohol or illicit drug use.   FAMILY HISTORY: Her brother died from pancreatic cancer.  REVIEW OF SYSTEMS: CONSTITUTIONAL: The patient denies any fever, chills, but complaining of generalized weakness and fatigue.  EYES: No blurry vision, double vision, glaucoma.  ENT: Denies any tinnitus, ear pain, hearing loss or epistaxis. RESPIRATORY: Denies any cough, wheezing, hemoptysis.  CARDIOVASCULAR: Denies any chest pain. No palpitations or syncope.  GASTROINTESTINAL: Complaining of nausea, vomiting and diarrhea and diffuse abdominal pain, more on the right side. Denies any hematemesis or melena.  GENITOURINARY: No dysuria or hematuria.  GYNECOLOGIC AND BREASTS: No breast mass or vaginal discharge.  ENDOCRINE: Denies polyuria, nocturia, increased sweating. HEMATOLOGIC: A chronic history of anemia and thrombocytopenia.  INTEGUMENTARY: No acne, rash, lesions.  MUSCULOSKELETAL: Denies  any pain in the neck, back, shoulders, knees.  NEUROLOGIC: Denies any vertigo, ataxia, dementia.  PSYCHIATRIC: The patient denies a history of anxiety. Denies any bipolar  disorder or OCD.   PHYSICAL EXAMINATION:  VITAL SIGNS: Temperature 97.4, pulse 93, respirations 18, blood pressure 157/75, pulse ox 99%.  GENERAL APPEARANCE: Not in acute distress, thin built and looks emaciated. HEENT: Normocephalic, atraumatic. Pupils are equally reactive to light and accommodation. No conjunctival injection. No sinus tenderness. No postnasal drip.  NECK: Supple. No JVD. No thyromegaly.  LUNGS: Moderate air entry, decreased breath sounds at the bases. A Port-A-Cath is intact on the right side of the anterior chest wall, no erythema. No anterior chest wall tenderness on palpation.  CARDIAC: S1, S2 normal. Regular rate and rhythm.  GASTROINTESTINAL: Firm. Bowel sounds are positive in all 4 quadrants but they are distant. Positive fluid thrill, positive shifting dullness, diffuse tenderness is present in the right. NEUROLOGICAL: Alert, oriented x3. Motor and sensory are grossly intact.  EXTREMITIES: 2+ pitting edema is present. No cyanosis. No clubbing. Pulses are 1+. Peripheral pulses are 1+.  SKIN: No lesions. No rashes. No acne. Normal turgor. Warm to touch.  BACK: No CVA tenderness. Normal.  MUSCULOSKELETAL: No tenderness in the neck area, shoulders, knees or hips.  PSYCH: Flat affect, depressed mood.  LABORATORY STUDIES AND IMAGING STUDIES: CAT scan of the abdomen and pelvis has revealed a 2.4 cm pancreatic head cystic lesion versus focally dilated duct with a punctate calcification along the posterior aspect. A heterogeneous liver with areas of low attenuation suspicious for multiple hepatic METS. Gallbladder faint, independent densities are seen  versus tiny gallstones. Massive abdominal and pelvic ascites, mildly thickened colon, which may be secondary to nondistention ascites, hypoproteinemia and hypoalbuminemia versus nonspecific colitis. No evidence of perforation or obstruction, anasarca, mild hazy interstitial edema versus pneumonia. Moderate posterior right pleural  effusion and right basilar atelectasis.  Glucose 96, BUN 30, creatinine 3.11, sodium 136, potassium 4.2, chloride 97, CO2 30, GFR 18, anion gap is 9, serum osmolality 272, calcium is 7.7, albumin is low at 1.6, total protein 5.4,  total bilirubin 2.9, AST 61, ALT 11. WBC 9.1, hemoglobin 10.0, hematocrit is 30.1, platelet count 153. PT 14.3, INR 1.1. Activated PTT 30.6.   ASSESSMENT AND PLAN: A 64 year old female with metastatic pancreatic cancer is presenting to the ER with a chief complaint of severe abdominal pain associated with nausea, vomiting, and diarrhea, will be admitted with the following assessment and plan.   Acute abdominal pain with massive ascites and nausea, vomiting, diarrhea, most likely a combination of acute gastroenteritis versus chemotherapy-induced. We will rule out spontaneous bacterial peritonitis.   PLAN: 1  We will keep her n.p.o. The patient will be on protein pump inhibitor.  2. We will check PT/INR in a.m.  3. Consult interventional radiology regarding ultrasound-guided paracentesis in a.m., and the fluid needs to be sent over to Pathology to rule out spontaneous bacterial peritonitis. We will start her on empiric antibiotics, cefotaxime, and pharmacy to dose. We will get stool tests for culture and sensitivity, Clostridium difficile toxin, WBC and Hemoccult.  4. End-stage renal disease on hemodialysis on Tuesday, Thursday, and Saturday. We will continue that. Nephrology consult is placed to Dr. Cherylann RatelLateef.  5. Anemia from end-stage renal disease. Stable at this time. No intervention is needed at this time.  6. Chronic obstructive pulmonary disease. Stable , not another exacerbation.  7. Nicotine dependence. The patient was counseled to quit.  8. Hyperlipidemia. The patient's cholesterol  medicine was recently discontinued. We will continue monitoring the patient.   Until patient makes a decision regarding her CODE STATUS, WE WILL CONTINUE FULL CODE STATUS.   The patient  will be transferred to [facility] group in a.m. to Dr. Sampson Goon.   TOTAL TIME SPENT ON THE ADMISSION: 60 minutes. Oncology consult is also placed to Dr. Doylene Canning. The plan of care was discussed in detail with the patient and her two daughters at bedside. They all verbalized their understanding of the plan.  ____________________________ Ramonita Lab, MD ag:jm D: 11/05/2012 05:48:16 ET T: 11/05/2012 15:08:28 ET JOB#: [id number]  cc: Ramonita Lab, MD, <Dictator> Ramonita Lab MD ELECTRONICALLY SIGNED 11/08/2012 6:25
